# Patient Record
Sex: Female | Born: 1970 | Race: Black or African American | Hispanic: No | Marital: Single | Smoking: Current every day smoker
Health system: Southern US, Community
[De-identification: ages and names within clinical notes are randomized; demographics above are authoritative.]

## PROBLEM LIST (undated history)

## (undated) DIAGNOSIS — K219 Gastro-esophageal reflux disease without esophagitis: Secondary | ICD-10-CM

## (undated) DIAGNOSIS — F32A Depression, unspecified: Secondary | ICD-10-CM

## (undated) DIAGNOSIS — M5106 Intervertebral disc disorders with myelopathy, lumbar region: Secondary | ICD-10-CM

## (undated) DIAGNOSIS — F329 Major depressive disorder, single episode, unspecified: Secondary | ICD-10-CM

## (undated) DIAGNOSIS — M62838 Other muscle spasm: Secondary | ICD-10-CM

## (undated) DIAGNOSIS — E0500 Thyrotoxicosis with diffuse goiter without thyrotoxic crisis or storm: Secondary | ICD-10-CM

## (undated) DIAGNOSIS — R609 Edema, unspecified: Secondary | ICD-10-CM

## (undated) DIAGNOSIS — M1990 Unspecified osteoarthritis, unspecified site: Secondary | ICD-10-CM

## (undated) DIAGNOSIS — I10 Essential (primary) hypertension: Secondary | ICD-10-CM

## (undated) DIAGNOSIS — G4730 Sleep apnea, unspecified: Secondary | ICD-10-CM

## (undated) DIAGNOSIS — C73 Malignant neoplasm of thyroid gland: Secondary | ICD-10-CM

## (undated) DIAGNOSIS — K259 Gastric ulcer, unspecified as acute or chronic, without hemorrhage or perforation: Secondary | ICD-10-CM

## (undated) HISTORY — PX: TONSILLECTOMY: SUR1361

## (undated) HISTORY — PX: OTHER SURGICAL HISTORY: SHX169

## (undated) HISTORY — PX: COLONOSCOPY: SHX174

## (undated) HISTORY — PX: CARPAL TUNNEL RELEASE: SHX101

---

## 1998-01-06 ENCOUNTER — Encounter: Admission: RE | Admit: 1998-01-06 | Discharge: 1998-01-06 | Payer: Self-pay | Admitting: Internal Medicine

## 1998-02-03 ENCOUNTER — Emergency Department (HOSPITAL_COMMUNITY): Admission: EM | Admit: 1998-02-03 | Discharge: 1998-02-03 | Payer: Self-pay | Admitting: Emergency Medicine

## 1998-06-11 ENCOUNTER — Emergency Department (HOSPITAL_COMMUNITY): Admission: EM | Admit: 1998-06-11 | Discharge: 1998-06-11 | Payer: Self-pay | Admitting: Emergency Medicine

## 1998-06-14 ENCOUNTER — Encounter: Admission: RE | Admit: 1998-06-14 | Discharge: 1998-06-14 | Payer: Self-pay | Admitting: Internal Medicine

## 1998-06-21 ENCOUNTER — Encounter: Admission: RE | Admit: 1998-06-21 | Discharge: 1998-06-21 | Payer: Self-pay | Admitting: Internal Medicine

## 1998-12-21 ENCOUNTER — Encounter: Admission: RE | Admit: 1998-12-21 | Discharge: 1998-12-21 | Payer: Self-pay | Admitting: Internal Medicine

## 1999-06-28 ENCOUNTER — Ambulatory Visit (HOSPITAL_COMMUNITY): Admission: RE | Admit: 1999-06-28 | Discharge: 1999-06-28 | Payer: Self-pay | Admitting: Family Medicine

## 1999-06-28 ENCOUNTER — Encounter: Payer: Self-pay | Admitting: Family Medicine

## 1999-12-01 ENCOUNTER — Encounter: Payer: Self-pay | Admitting: Emergency Medicine

## 1999-12-01 ENCOUNTER — Inpatient Hospital Stay (HOSPITAL_COMMUNITY): Admission: EM | Admit: 1999-12-01 | Discharge: 1999-12-05 | Payer: Self-pay | Admitting: Emergency Medicine

## 1999-12-01 ENCOUNTER — Encounter: Payer: Self-pay | Admitting: Pulmonary Disease

## 1999-12-02 ENCOUNTER — Encounter: Payer: Self-pay | Admitting: Pulmonary Disease

## 2000-07-10 ENCOUNTER — Ambulatory Visit (HOSPITAL_COMMUNITY): Admission: RE | Admit: 2000-07-10 | Discharge: 2000-07-10 | Payer: Self-pay | Admitting: Family Medicine

## 2000-07-10 ENCOUNTER — Encounter: Payer: Self-pay | Admitting: Family Medicine

## 2000-08-02 ENCOUNTER — Emergency Department (HOSPITAL_COMMUNITY): Admission: EM | Admit: 2000-08-02 | Discharge: 2000-08-02 | Payer: Self-pay | Admitting: Emergency Medicine

## 2000-08-02 ENCOUNTER — Encounter: Payer: Self-pay | Admitting: Emergency Medicine

## 2011-03-14 DIAGNOSIS — K644 Residual hemorrhoidal skin tags: Secondary | ICD-10-CM | POA: Diagnosis not present

## 2011-03-26 DIAGNOSIS — L0291 Cutaneous abscess, unspecified: Secondary | ICD-10-CM | POA: Diagnosis not present

## 2011-04-03 DIAGNOSIS — K602 Anal fissure, unspecified: Secondary | ICD-10-CM | POA: Diagnosis not present

## 2011-04-04 DIAGNOSIS — Z01818 Encounter for other preprocedural examination: Secondary | ICD-10-CM | POA: Diagnosis not present

## 2011-04-04 DIAGNOSIS — K602 Anal fissure, unspecified: Secondary | ICD-10-CM | POA: Diagnosis not present

## 2011-04-05 DIAGNOSIS — I10 Essential (primary) hypertension: Secondary | ICD-10-CM | POA: Diagnosis not present

## 2011-04-05 DIAGNOSIS — K649 Unspecified hemorrhoids: Secondary | ICD-10-CM | POA: Diagnosis not present

## 2011-04-05 DIAGNOSIS — K625 Hemorrhage of anus and rectum: Secondary | ICD-10-CM | POA: Diagnosis not present

## 2011-04-05 DIAGNOSIS — K602 Anal fissure, unspecified: Secondary | ICD-10-CM | POA: Diagnosis not present

## 2011-04-05 DIAGNOSIS — Z01818 Encounter for other preprocedural examination: Secondary | ICD-10-CM | POA: Diagnosis not present

## 2011-04-09 DIAGNOSIS — K603 Anal fistula: Secondary | ICD-10-CM | POA: Diagnosis not present

## 2011-04-09 DIAGNOSIS — K602 Anal fissure, unspecified: Secondary | ICD-10-CM | POA: Diagnosis not present

## 2011-04-09 DIAGNOSIS — K649 Unspecified hemorrhoids: Secondary | ICD-10-CM | POA: Diagnosis not present

## 2011-04-09 DIAGNOSIS — I10 Essential (primary) hypertension: Secondary | ICD-10-CM | POA: Diagnosis not present

## 2011-04-26 DIAGNOSIS — E039 Hypothyroidism, unspecified: Secondary | ICD-10-CM | POA: Diagnosis not present

## 2011-04-26 DIAGNOSIS — K649 Unspecified hemorrhoids: Secondary | ICD-10-CM | POA: Diagnosis not present

## 2011-07-12 DIAGNOSIS — E039 Hypothyroidism, unspecified: Secondary | ICD-10-CM | POA: Diagnosis not present

## 2011-07-12 DIAGNOSIS — F329 Major depressive disorder, single episode, unspecified: Secondary | ICD-10-CM | POA: Diagnosis not present

## 2011-08-17 DIAGNOSIS — I10 Essential (primary) hypertension: Secondary | ICD-10-CM | POA: Diagnosis not present

## 2011-08-17 DIAGNOSIS — L259 Unspecified contact dermatitis, unspecified cause: Secondary | ICD-10-CM | POA: Diagnosis not present

## 2011-09-21 DIAGNOSIS — I10 Essential (primary) hypertension: Secondary | ICD-10-CM | POA: Diagnosis not present

## 2011-09-21 DIAGNOSIS — E039 Hypothyroidism, unspecified: Secondary | ICD-10-CM | POA: Diagnosis not present

## 2011-12-24 DIAGNOSIS — M461 Sacroiliitis, not elsewhere classified: Secondary | ICD-10-CM | POA: Diagnosis not present

## 2011-12-24 DIAGNOSIS — G5600 Carpal tunnel syndrome, unspecified upper limb: Secondary | ICD-10-CM | POA: Diagnosis not present

## 2012-01-07 DIAGNOSIS — F17200 Nicotine dependence, unspecified, uncomplicated: Secondary | ICD-10-CM | POA: Diagnosis not present

## 2012-01-07 DIAGNOSIS — M25519 Pain in unspecified shoulder: Secondary | ICD-10-CM | POA: Diagnosis not present

## 2012-01-07 DIAGNOSIS — R252 Cramp and spasm: Secondary | ICD-10-CM | POA: Diagnosis not present

## 2012-01-07 DIAGNOSIS — M19019 Primary osteoarthritis, unspecified shoulder: Secondary | ICD-10-CM | POA: Diagnosis not present

## 2012-01-07 DIAGNOSIS — E039 Hypothyroidism, unspecified: Secondary | ICD-10-CM | POA: Diagnosis not present

## 2012-04-29 DIAGNOSIS — E039 Hypothyroidism, unspecified: Secondary | ICD-10-CM | POA: Diagnosis not present

## 2012-04-29 DIAGNOSIS — R252 Cramp and spasm: Secondary | ICD-10-CM | POA: Diagnosis not present

## 2012-09-03 DIAGNOSIS — Z3009 Encounter for other general counseling and advice on contraception: Secondary | ICD-10-CM | POA: Diagnosis not present

## 2012-09-03 DIAGNOSIS — I10 Essential (primary) hypertension: Secondary | ICD-10-CM | POA: Diagnosis not present

## 2012-09-03 DIAGNOSIS — E039 Hypothyroidism, unspecified: Secondary | ICD-10-CM | POA: Diagnosis not present

## 2012-11-12 DIAGNOSIS — M5137 Other intervertebral disc degeneration, lumbosacral region: Secondary | ICD-10-CM | POA: Diagnosis not present

## 2012-11-12 DIAGNOSIS — I10 Essential (primary) hypertension: Secondary | ICD-10-CM | POA: Diagnosis not present

## 2012-11-12 DIAGNOSIS — M545 Low back pain: Secondary | ICD-10-CM | POA: Diagnosis not present

## 2012-11-12 DIAGNOSIS — R209 Unspecified disturbances of skin sensation: Secondary | ICD-10-CM | POA: Diagnosis not present

## 2012-11-12 DIAGNOSIS — M4310 Spondylolisthesis, site unspecified: Secondary | ICD-10-CM | POA: Diagnosis not present

## 2012-11-12 DIAGNOSIS — E039 Hypothyroidism, unspecified: Secondary | ICD-10-CM | POA: Diagnosis not present

## 2012-11-12 DIAGNOSIS — M549 Dorsalgia, unspecified: Secondary | ICD-10-CM | POA: Diagnosis not present

## 2012-11-12 DIAGNOSIS — M47817 Spondylosis without myelopathy or radiculopathy, lumbosacral region: Secondary | ICD-10-CM | POA: Diagnosis not present

## 2012-11-12 DIAGNOSIS — R252 Cramp and spasm: Secondary | ICD-10-CM | POA: Diagnosis not present

## 2013-02-02 DIAGNOSIS — M549 Dorsalgia, unspecified: Secondary | ICD-10-CM | POA: Diagnosis not present

## 2013-02-02 DIAGNOSIS — J069 Acute upper respiratory infection, unspecified: Secondary | ICD-10-CM | POA: Diagnosis not present

## 2013-02-02 DIAGNOSIS — I10 Essential (primary) hypertension: Secondary | ICD-10-CM | POA: Diagnosis not present

## 2013-02-02 DIAGNOSIS — E039 Hypothyroidism, unspecified: Secondary | ICD-10-CM | POA: Diagnosis not present

## 2013-10-15 ENCOUNTER — Encounter (HOSPITAL_COMMUNITY): Payer: Self-pay | Admitting: Emergency Medicine

## 2013-10-15 ENCOUNTER — Emergency Department (HOSPITAL_COMMUNITY): Payer: Medicare Other

## 2013-10-15 ENCOUNTER — Emergency Department (HOSPITAL_COMMUNITY)
Admission: EM | Admit: 2013-10-15 | Discharge: 2013-10-15 | Disposition: A | Discharge status: Home / Self Care | Payer: Medicare Other | Attending: Emergency Medicine | Admitting: Emergency Medicine

## 2013-10-15 DIAGNOSIS — Y929 Unspecified place or not applicable: Secondary | ICD-10-CM | POA: Insufficient documentation

## 2013-10-15 DIAGNOSIS — W19XXXA Unspecified fall, initial encounter: Secondary | ICD-10-CM

## 2013-10-15 DIAGNOSIS — F17200 Nicotine dependence, unspecified, uncomplicated: Secondary | ICD-10-CM | POA: Diagnosis not present

## 2013-10-15 DIAGNOSIS — Z85850 Personal history of malignant neoplasm of thyroid: Secondary | ICD-10-CM | POA: Insufficient documentation

## 2013-10-15 DIAGNOSIS — M545 Low back pain, unspecified: Secondary | ICD-10-CM | POA: Diagnosis not present

## 2013-10-15 DIAGNOSIS — I10 Essential (primary) hypertension: Secondary | ICD-10-CM | POA: Diagnosis not present

## 2013-10-15 DIAGNOSIS — R296 Repeated falls: Secondary | ICD-10-CM | POA: Diagnosis not present

## 2013-10-15 DIAGNOSIS — S79929A Unspecified injury of unspecified thigh, initial encounter: Secondary | ICD-10-CM | POA: Diagnosis not present

## 2013-10-15 DIAGNOSIS — S99919A Unspecified injury of unspecified ankle, initial encounter: Secondary | ICD-10-CM

## 2013-10-15 DIAGNOSIS — S99929A Unspecified injury of unspecified foot, initial encounter: Secondary | ICD-10-CM

## 2013-10-15 DIAGNOSIS — F329 Major depressive disorder, single episode, unspecified: Secondary | ICD-10-CM | POA: Diagnosis not present

## 2013-10-15 DIAGNOSIS — R21 Rash and other nonspecific skin eruption: Secondary | ICD-10-CM | POA: Insufficient documentation

## 2013-10-15 DIAGNOSIS — Z79899 Other long term (current) drug therapy: Secondary | ICD-10-CM | POA: Diagnosis not present

## 2013-10-15 DIAGNOSIS — M25559 Pain in unspecified hip: Secondary | ICD-10-CM | POA: Diagnosis not present

## 2013-10-15 DIAGNOSIS — Z8739 Personal history of other diseases of the musculoskeletal system and connective tissue: Secondary | ICD-10-CM | POA: Insufficient documentation

## 2013-10-15 DIAGNOSIS — S79919A Unspecified injury of unspecified hip, initial encounter: Secondary | ICD-10-CM | POA: Diagnosis not present

## 2013-10-15 DIAGNOSIS — F3289 Other specified depressive episodes: Secondary | ICD-10-CM | POA: Diagnosis not present

## 2013-10-15 DIAGNOSIS — IMO0002 Reserved for concepts with insufficient information to code with codable children: Secondary | ICD-10-CM | POA: Insufficient documentation

## 2013-10-15 DIAGNOSIS — S8990XA Unspecified injury of unspecified lower leg, initial encounter: Secondary | ICD-10-CM | POA: Diagnosis not present

## 2013-10-15 DIAGNOSIS — Y9389 Activity, other specified: Secondary | ICD-10-CM | POA: Diagnosis not present

## 2013-10-15 DIAGNOSIS — M79605 Pain in left leg: Secondary | ICD-10-CM

## 2013-10-15 DIAGNOSIS — M79609 Pain in unspecified limb: Secondary | ICD-10-CM | POA: Diagnosis not present

## 2013-10-15 HISTORY — DX: Major depressive disorder, single episode, unspecified: F32.9

## 2013-10-15 HISTORY — DX: Depression, unspecified: F32.A

## 2013-10-15 HISTORY — DX: Unspecified osteoarthritis, unspecified site: M19.90

## 2013-10-15 HISTORY — DX: Essential (primary) hypertension: I10

## 2013-10-15 HISTORY — DX: Intervertebral disc disorders with myelopathy, lumbar region: M51.06

## 2013-10-15 HISTORY — DX: Other muscle spasm: M62.838

## 2013-10-15 HISTORY — DX: Malignant neoplasm of thyroid gland: C73

## 2013-10-15 MED ORDER — OXYCODONE-ACETAMINOPHEN 5-325 MG PO TABS
2.0000 | ORAL_TABLET | Freq: Once | ORAL | Status: AC
  Administered 2013-10-15: 2 via ORAL
  Filled 2013-10-15: qty 2

## 2013-10-15 MED ORDER — OXYCODONE-ACETAMINOPHEN 5-325 MG PO TABS: 1.0000 | ORAL_TABLET | Freq: Four times a day (QID) | ORAL | Status: DC | PRN

## 2013-10-15 MED ORDER — PERMETHRIN 5 % EX CREA: 1.0000 "application " | TOPICAL_CREAM | Freq: Once | CUTANEOUS | Status: DC

## 2013-10-15 MED ORDER — LISINOPRIL 10 MG PO TABS: 10.0000 mg | ORAL_TABLET | Freq: Every day | ORAL | Status: DC

## 2013-10-15 MED ORDER — CYCLOBENZAPRINE HCL 10 MG PO TABS: 10.0000 mg | ORAL_TABLET | Freq: Two times a day (BID) | ORAL | Status: DC | PRN

## 2013-10-15 NOTE — Discharge Instructions (Signed)
°Emergency Department Resource Guide °1) Find a Doctor and Pay Out of Pocket °Although you won't have to find out who is covered by your insurance plan, it is a good idea to ask around and get recommendations. You will then need to call the office and see if the doctor you have chosen will accept you as a new patient and what types of options they offer for patients who are self-pay. Some doctors offer discounts or will set up payment plans for their patients who do not have insurance, but you will need to ask so you aren't surprised when you get to your appointment. ° °2) Contact Your Local Health Department °Not all health departments have doctors that can see patients for sick visits, but many do, so it is worth a call to see if yours does. If you don't know where your local health department is, you can check in your phone book. The CDC also has a tool to help you locate your state's health department, and many state websites also have listings of all of their local health departments. ° °3) Find a Walk-in Clinic °If your illness is not likely to be very severe or complicated, you may want to try a walk in clinic. These are popping up all over the country in pharmacies, drugstores, and shopping centers. They're usually staffed by nurse practitioners or physician assistants that have been trained to treat common illnesses and complaints. They're usually fairly quick and inexpensive. However, if you have serious medical issues or chronic medical problems, these are probably not your best option. ° °No Primary Care Doctor: °- Call [facility] at  832-8000 - they can help you locate a primary care doctor that  accepts your insurance, provides certain services, etc. °- Physician Referral Service- 1-800-533-3463 ° °Chronic Pain Problems: °Organization         Address  Phone   Notes  °[facility]  (336) 297-2271 Patients need to be referred by their primary care doctor.  ° °Medication  Assistance: °Organization         Address  Phone   Notes  °[place] [facility] [address] °[place], [place] [address] (336) 641-8030 --Must be a resident of [place] °-- Must have NO insurance coverage whatsoever (no Medicaid/ Medicare, etc.) °-- The pt. MUST have a primary care doctor that directs their care regularly and follows them in the community °  °[facility]  (866) 331-1348   °[employer]  (888) 892-1162   ° °Agencies that provide inexpensive medical care: °Organization         Address  Phone   Notes  °[facility]  (336) 832-8035   ° Internal Medicine    (336) 832-7272   °[facility] [address] °[place], [place] [address] (336) 832-4777   °[facility] [address], °[place] (336) 271-4999   °[facility]    (336) 373-0678   °[facility]    (336) 272-1050   °[facility] ° [address], [place] Phone:  (336) 832-4444, Fax:  (336) 832-4440 Hours of Operation:  9 am - 6 pm, M-F.  Also accepts Medicaid/Medicare and self-pay.  °[facility] ° [address], [place] Phone: (336) 832-3150, Fax: (336) 832-3151. Hours of Operation:  8:30 am - 5:30 pm, M-F.  Also accepts Medicaid and self-pay.  °[facility] [place] [address], [place] Phone: (336) 878-6027   °[facility] [address], [place], [place] (336)723-1848, Ext. 123 Mondays & Thursdays: 7-9 AM.  First 15 patients are seen on a first come, first serve basis. °  ° °Medicaid-accepting [place] Providers: ° °Organization         Address  Phone   Notes  °[facility] [address], [place] (336) 641-2100 Also accepts self-pay patients.  °[facility] [address], [place] ° (336) 856-9996   °[facility] [address], [place]  (336) 288-8857   °[facility] [address] [place] [address], [place] (336) 299-7000   °Veita Bland [address], [place]  ° (336) 373-1557 Only accepts [place] Access Medicaid patients after they have their name applied to their card.  ° °Self-Pay (no insurance) in [place]: ° °Organization         Address  Phone   Notes  °Sickle Cell Patients, [facility] [address], [place] (336) 832-1970   °[facility] [address], [place] (336) 832-4400   °[facility] [place] ° [address], [place] (336) 992-4800   °[facility]/Dr. Osei-Bonsu ° [address] [place] [address], [place] or [address], [place] (336) 841-8500 Phone number for both [place] and [place] locations is the same.  °[facility] [address], [place] (336) 299-0000   °[facility] [address] [place] [address], [place] or [address] (336) 852-7530 °(336) 878-2260   °[facility] [address], [place] (336) 350-1642, phone; (336) 294-5005, fax Sees patients 1st and 3rd Saturday of every month.  Must not qualify for public or private insurance (i.e. Medicaid, Medicare, [place] Health Choice, Veterans' Benefits) • Household income should be no more than 200% of the poverty level •The clinic cannot treat you if you are pregnant or think you are pregnant • Sexually transmitted diseases are not treated at the clinic.  ° ° °Dental Care: °Organization         Address  Phone  Notes  °[place] [facility] [address], [place] (336) 641-6152 Accepts children up to age 21 who are enrolled in Medicaid or [place] Health Choice; pregnant women with a Medicaid card; and children who have applied for Medicaid or [place] Health Choice, but were declined, whose parents can pay a reduced fee at time of service.  °[place]  [facility] [place]  [address], [place] (336) 641-7733 Accepts children up to age 21 who are enrolled in Medicaid or [place] Health Choice; pregnant women with a Medicaid card; and children who have applied for Medicaid or [place] Health Choice, but were declined, whose parents can pay a reduced fee at time of service.  °[facility] ° [address], [place] (336) 641-4533 Patients are seen by appointment only. Walk-ins are not accepted. [facility] will see patients 18 years of age and older. °Monday - Tuesday (8am-5pm) °Most Wednesdays (8:30-5pm) °$30 per visit, cash only  °[facility] ° [address], [place] (336) 641-4533 Patients are seen by appointment only. Walk-ins are not accepted. [facility] will see patients 18 years of age and older. °One   Wednesday Evening (Monthly: Volunteer Based).  $30 per visit, cash only  °[facility]  (919) 537-3737 for adults; Children under age 4, call Graduate Pediatric Dentistry at (919) [phone]. Children aged 4-14, please call (919) [phone] to request a pediatric application. ° Dental services are provided in all areas of dental care including fillings, crowns and bridges, complete and partial dentures, implants, gum treatment, root canals, and extractions. Preventive care is also provided. Treatment is provided to both adults and children. °Patients are selected via a lottery and there is often a waiting list. °  °[facility] [address], °[place] ° (336) 763-8833 [email or web address] °  °[facility] [address], [place], [place] (336)723-1848, Ext. 123 Second and Fourth Thursday of each month, opens at 6:30 AM; Clinic ends at 9 AM.  Patients are seen on a first-come first-served basis, and a limited number are seen during each clinic.  ° °[facility] ° [address], [place], [place] (336) 723-7904    Eligibility Requirements °You must have lived in [place], [place], or [place] counties for at least the last three months. °  You cannot be eligible for state or federal sponsored healthcare insurance, including Veterans Administration, Medicaid, or Medicare. °  You generally cannot be eligible for healthcare insurance through your employer.  °  How to apply: °Eligibility screenings are held every Tuesday and Wednesday afternoon from 1:00 pm until 4:00 pm. You do not need an appointment for the interview!  °[facility] [address], [place], [place] 336-631-2330   °[place] [facility]  336-342-8273   °[place]  336-703-3100   ° [facility]  336-570-6415   ° °Behavioral Health Resources in the Community: °Intensive Outpatient Programs °Organization         Address  Phone  Notes  °[facility] [address], [place], [place] 336-878-6098   °[facility] [address], [place], [place] 336-832-9800   °[facility] [address], [place], [place] ° 336-882-2125   °[facility] [address],  °[place], [place] 1-800-853-5163 or 336-641-4981   °Substance Abuse Resources °Organization         Address  Phone  Notes  °[facility]  336-882-2125   °[facility]  336-784-9470   °[facility]  336-285-9073   °[facility]  336-845-3988   °Residential & Outpatient Substance Abuse Program  1-800-659-3381   °Psychological Services °Organization         Address  Phone  Notes  °[facility]  336- 832-9600   °[facility]  336- 378-7881   °[facility] [address], [place] 1-800-853-5163 or 336-641-4981   ° °Mobile Crisis Teams °Organization         Address  Phone  Notes  °[facility], Mobile Crisis Care Unit  1-877-626-1772   °Assertive °[facility] ° [address]  [place], [place] 336-834-9664   °Sharon DeEsch [address] °[place] 336-554-5454   ° °Self-Help/Support Groups °Organization         Address  Phone             Notes  °[facility]  - variety of support groups  336- 373-1402 Call for more information  °Narcotics Anonymous (NA), [facility] [address], °[place]  2 meetings at this location  ° °  Residential Treatment Programs °Organization         Address  Phone  Notes  °[facility] [address],    °[place]  1-866-801-8205   °[facility] ° [address], [place], [place] 704-293-8524   °[facility] [address], [place] 336-845-3988 Admissions: 8am-3pm M-F  °[facility] [address]    °[place], [place] 336-841-1104   °[facility] [address], [place], [place] 336-379-7146   °[facility] [address]  °[place], [place] 336-285-9073   °[facility] Intensive Outpatient [address], [place], [place] 336-852-3033   °[facility] [address]  °[place], [place] 1-877-615-2722 or 336-784-9470   °[facility] [address] [place], [place] 336-227-7417 Accepts Medicaid  °[facility] [address]  °[place] 1-800-659-3381 Substance Abuse/Addiction Treatment  ° °[place] Behavioral Health Resources °Organization         Address  Phone  Notes  °[facility]  (888) 581-9988   °Julie Brannon, PhD [address] [place], [place]   (336) 349-5553 or (336) 951-0000   °[facility]   [address] °[place], [place] (336) 349-4454   °[facility] [address], [place], [place] (336) 342-8316 Insurance/Medicaid/sponsorship through [facility]  °[facility] [address]                                    [place], [place] (336) 342-8316 Therapy/tele-psych/case    °[facility] [address]  ° [place], [place] (336) 349-2233    °Dr. Arfeen  (336) 349-4544   °[facility] [place]  [employer] [place] [facility] 1) [address], [place] °2) [address], [place] °3)  [address], [place] (336) 349-3220 °(336) 342-7768 ° °(336) 342-8140   °[place] [facility] (336) 342-1394 or (336) 342-3537 (After Hours)    ° ° °

## 2013-10-15 NOTE — Progress Notes (Signed)
VASCULAR LAB PRELIMINARY  PRELIMINARY  PRELIMINARY  PRELIMINARY  Left lower extremity venous duplex completed.    Preliminary report:  Left:  No evidence of DVT, superficial thrombosis, or Baker's cyst.  Riad Wagley, RVS 10/15/2013, 2:34 PM

## 2013-10-15 NOTE — ED Provider Notes (Signed)
CSN: [id number]     Arrival date & time 10/15/13  1028 History   First MD Initiated Contact with Patient 10/15/13 1224     Chief Complaint  Patient presents with  . Leg Pain    lower leg swelling and pain  . Hip Pain    radiating pain  . Back Pain    fell 7 days ago     (Consider location/radiation/quality/duration/timing/severity/associated sxs/prior Treatment) Patient is a 43 y.o. female presenting with leg pain. The history is provided by the patient.  Leg Pain Location:  Leg Time since incident:  1 week Injury: yes   Mechanism of injury: fall   Fall:    Fall occurred: off toilet.   Impact surface:  Hard floor   Point of impact:  Unable to specify   Entrapped after fall: no   Leg location:  L leg Pain details:    Quality:  Aching   Radiates to:  Does not radiate   Severity:  Moderate   Onset quality:  Sudden   Duration:  1 week   Timing:  Constant   Progression:  Unchanged Chronicity:  New Dislocation: no   Foreign body present:  No foreign bodies Prior injury to area:  No Relieved by:  Nothing Worsened by:  Activity Ineffective treatments:  None tried Associated symptoms: no back pain, no fatigue, no fever and no neck pain     Past Medical History  Diagnosis Date  . Thyroid cancer   . Depression   . Muscle spasms of both lower extremities   . Disc disease with myelopathy, lumbar   . Arthritis   . Hypertension    Past Surgical History  Procedure Laterality Date  . Colonoscopy      repair of fissures  . Glands      under arms-removed   Family History  Problem Relation Age of Onset  . Diabetes Mother   . Hypertension Mother   . Diabetes Father   . Hypertension Father   . Heart failure Father    History  Substance Use Topics  . Smoking status: Current Every Day Smoker    Types: Cigarettes  . Smokeless tobacco: Not on file  . Alcohol Use: Yes   OB History   Grav Para Term Preterm Abortions TAB SAB Ect Mult Living                 Review  of Systems  Constitutional: Negative for fever and fatigue.  HENT: Negative for congestion and drooling.   Eyes: Negative for pain.  Respiratory: Negative for cough and shortness of breath.   Cardiovascular: Negative for chest pain.  Gastrointestinal: Negative for nausea, vomiting, abdominal pain and diarrhea.  Genitourinary: Negative for dysuria and hematuria.  Musculoskeletal: Negative for back pain, gait problem and neck pain.  Skin: Negative for color change.  Neurological: Negative for dizziness and headaches.  Hematological: Negative for adenopathy.  Psychiatric/Behavioral: Negative for behavioral problems.  All other systems reviewed and are negative.     Allergies  Review of patient's allergies indicates no known allergies.  Home Medications   Prior to Admission medications   Medication Sig Start Date End Date Taking? Authorizing Provider  acetaminophen (TYLENOL) 500 MG tablet Take 1,500 mg by mouth every 6 (six) hours as needed (pain.).   Yes Historical Provider, MD  hydrOXYzine (ATARAX/VISTARIL) 25 MG tablet Take 25 mg by mouth every 6 (six) hours as needed for itching.   Yes Historical Provider, MD  levothyroxine (SYNTHROID, LEVOTHROID)  175 MCG tablet Take 175 mcg by mouth daily before breakfast.   Yes Historical Provider, MD  lisinopril (PRINIVIL,ZESTRIL) 10 MG tablet Take 10 mg by mouth daily.   Yes Historical Provider, MD  NIFEdipine (PROCARDIA-XL/ADALAT CC) 60 MG 24 hr tablet Take 60 mg by mouth daily.   Yes Historical Provider, MD  ranitidine (ZANTAC) 300 MG tablet Take 300 mg by mouth 2 (two) times daily.   Yes Historical Provider, MD  venlafaxine XR (EFFEXOR-XR) 75 MG 24 hr capsule Take 225 mg by mouth daily with breakfast.    Yes Historical Provider, MD   BP 113/57  Pulse 103  Temp(Src) 98.4 F (36.9 C) (Oral)  Resp 20  SpO2 100%  LMP 10/06/2013 Physical Exam  Nursing note and vitals reviewed. Constitutional: She is oriented to person, place, and time. She  appears well-developed and well-nourished.  HENT:  Head: Normocephalic and atraumatic.  Mouth/Throat: Oropharynx is clear and moist. No oropharyngeal exudate.  Eyes: Conjunctivae and EOM are normal. Pupils are equal, round, and reactive to light.  Neck: Normal range of motion. Neck supple.  Cardiovascular: Normal rate, regular rhythm, normal heart sounds and intact distal pulses.  Exam reveals no gallop and no friction rub.   No murmur heard. Pulmonary/Chest: Effort normal and breath sounds normal. No respiratory distress. She has no wheezes.  Abdominal: Soft. Bowel sounds are normal. There is no tenderness. There is no rebound and no guarding.  Musculoskeletal: Normal range of motion. She exhibits tenderness. She exhibits no edema.  Mild tenderness to palpation of the left posterior thigh and left lumbar paraspinal area.  Small soft nodular area noted over the left mid tibia.   Neurological: She is alert and oriented to person, place, and time.  Reflex Scores:      Patellar reflexes are 2+ on the right side and 2+ on the left side.      Achilles reflexes are 2+ on the right side and 2+ on the left side. alert, oriented x3 speech: normal in context and clarity memory: intact grossly cranial nerves II-XII: intact motor strength: full proximally and distally no involuntary movements or tremors Sensation: Mildly altered sensation to light touch in the LLE, otherwise intact to light touch diffusely  cerebellar: finger-to-nose and heel-to-shin intact gait: antalgic gait   Skin: Skin is warm and dry. Rash noted.  Several macular papular lesions noted sparingly on the mid back.  Psychiatric: She has a normal mood and affect. Her behavior is normal.    ED Course  Procedures (including critical care time) Labs Review Labs Reviewed - No data to display  Imaging Review Dg Hip Complete Left  10/15/2013   CLINICAL DATA:  Severe thinning cramps  EXAM: LEFT HIP - COMPLETE 2+ VIEW   COMPARISON:  None.  FINDINGS: There is no evidence of hip fracture or dislocation. There is no evidence of arthropathy or other focal bone abnormality.  IMPRESSION: Negative.   Electronically Signed   By: Kathreen Devoid   On: 10/15/2013 12:48   Dg Tibia/fibula Left  10/15/2013   CLINICAL DATA:  Severe pain and cramps  EXAM: LEFT TIBIA AND FIBULA - 2 VIEW  COMPARISON:  None.  FINDINGS: No acute fracture or dislocation. No lytic or sclerotic osseous lesion. Tricompartmental osteoarthritis of the knee.  IMPRESSION: No acute osseous injury of the left tibia or fibula.   Electronically Signed   By: Kathreen Devoid   On: 10/15/2013 12:48     EKG Interpretation   Date/Time:  Thursday  October 15 2013 10:43:35 EDT Ventricular Rate:  94 PR Interval:    QRS Duration: 75 QT Interval:  446 QTC Calculation: 558 R Axis:   56 Text Interpretation:  sinus rhythm Borderline repolarization abnormality  Prolonged QT interval Baseline wander in lead(s) II III aVL aVF V4 no  previous for comparison Confirmed by Tod Abrahamsen  MD, [place] ([id number]) on  10/15/2013 12:28:17 PM      MDM   Final diagnoses:  Low back pain radiating to left lower extremity  Fall, initial encounter    12:53 PM 43 y.o. female pw mechanical fall while getting off toilet 1 week ago. Did not hit head or have loc. Patient has had paresthesias in the left lower extremity worsening since the fall. She has no weakness. She states that the pain radiates from her left low back to her left thigh. She is afebrile and vital signs are unremarkable here. Will get screening imaging and pain control. Possible sciatica. Pt also concerned about DVT. Will get [place] to rule this out.   3:12 PM: I interpreted/reviewed the labs and/or imaging which were non-contributory.  Pt's paresthesias likely related to sciatica and recent fall. Pt notes she was supposed to have back surgery in the past but never got it. She is able to ambulate. Pt requests refill of her lisinopril  and also tx for scabies.  I have discussed the diagnosis/risks/treatment options with the patient and believe the pt to be eligible for discharge home to follow-up with and establish w/ a pcp as well as an orthopedist for formal evaluation of her back. We also discussed returning to the ED immediately if new or worsening sx occur. We discussed the sx which are most concerning (e.g., worsening pain, fever, weakness, numbness, bowel/bladd incont) that necessitate immediate return. Medications administered to the patient during their visit and any new prescriptions provided to the patient are listed below.  Medications given during this visit Medications  oxyCODONE-acetaminophen (PERCOCET/ROXICET) 5-325 MG per tablet 2 tablet (2 tablets Oral Given 10/15/13 1315)    Discharge Medication List as of 10/15/2013  3:20 PM    START taking these medications   Details  cyclobenzaprine (FLEXERIL) 10 MG tablet Take 1 tablet (10 mg total) by mouth 2 (two) times daily as needed for muscle spasms., Starting 10/15/2013, Until Discontinued, Print    !! lisinopril (PRINIVIL) 10 MG tablet Take 1 tablet (10 mg total) by mouth daily., Starting 10/15/2013, Until Discontinued, Print    oxyCODONE-acetaminophen (PERCOCET) 5-325 MG per tablet Take 1 tablet by mouth every 6 (six) hours as needed for moderate pain., Starting 10/15/2013, Until Discontinued, Print    permethrin (ACTICIN) 5 % cream Apply 1 application topically once. Put a thin layer on the skin from the neck to the toes. Put in the skin folds, creases, and spaces between the fingers and toes. Wash off after 8-14 hrs. May be repeated in 1 week., Starting 10/15/2013, Print     !! - Potential duplicate medications found. Please discuss with provider.       Pamella Pert, MD 10/15/13 [phone]

## 2013-10-15 NOTE — ED Notes (Signed)
Bed: WA03 Expected date:  Expected time:  Means of arrival:  Comments: seizure

## 2013-10-15 NOTE — ED Notes (Addendum)
Pt reported  That she  fell 7 days ago, twisted leg and foot due to loss of balance as she got off commode. Pt has swelling and knots in l/lower leg,pain is radiating up to hip and back. Pt stated that she has difficulty walking, mother assisted pt to car. Pt is alert, oriented, appropriate.  Pt stated that she has hx of blood clots, under treatment for "thyroid cancer" Pt postponed care hoping that the pain would go away. Moved back to this area 6 months ago. Does not have PCP

## 2013-10-15 NOTE — ED Notes (Signed)
Venous study is neg. MD aware.

## 2013-10-27 ENCOUNTER — Ambulatory Visit (INDEPENDENT_AMBULATORY_CARE_PROVIDER_SITE_OTHER): Payer: Medicare Other | Admitting: Internal Medicine

## 2013-10-27 ENCOUNTER — Other Ambulatory Visit (INDEPENDENT_AMBULATORY_CARE_PROVIDER_SITE_OTHER): Payer: Medicare Other

## 2013-10-27 ENCOUNTER — Encounter: Payer: Self-pay | Admitting: Internal Medicine

## 2013-10-27 VITALS — BP 132/62 | HR 102 | Temp 98.1°F | Resp 22 | Ht 64.0 in | Wt 281.4 lb

## 2013-10-27 DIAGNOSIS — I10 Essential (primary) hypertension: Secondary | ICD-10-CM

## 2013-10-27 DIAGNOSIS — Z6841 Body Mass Index (BMI) 40.0 and over, adult: Secondary | ICD-10-CM

## 2013-10-27 DIAGNOSIS — Z79899 Other long term (current) drug therapy: Secondary | ICD-10-CM | POA: Diagnosis not present

## 2013-10-27 DIAGNOSIS — M545 Low back pain, unspecified: Secondary | ICD-10-CM | POA: Diagnosis not present

## 2013-10-27 DIAGNOSIS — M519 Unspecified thoracic, thoracolumbar and lumbosacral intervertebral disc disorder: Secondary | ICD-10-CM | POA: Diagnosis not present

## 2013-10-27 DIAGNOSIS — E0500 Thyrotoxicosis with diffuse goiter without thyrotoxic crisis or storm: Secondary | ICD-10-CM | POA: Diagnosis not present

## 2013-10-27 LAB — BASIC METABOLIC PANEL
BUN: 8 mg/dL (ref 6–23)
CHLORIDE: 106 meq/L (ref 96–112)
CO2: 23 mEq/L (ref 19–32)
CREATININE: 0.6 mg/dL (ref 0.4–1.2)
Calcium: 9.1 mg/dL (ref 8.4–10.5)
GFR: 128.83 mL/min (ref >60.00)
Glucose, Bld: 81 mg/dL (ref 70–99)
Potassium: 3.7 mEq/L (ref 3.5–5.1)
SODIUM: 134 meq/L — AB (ref 135–145)

## 2013-10-27 NOTE — Patient Instructions (Signed)
We will check your blood work today and try to get your records. Please go to medical records in the basement near the lab so we can get your records from [place].   Keep working with the back doctor.   We will see you back in about 6 months to check on how you are doing or sooner if you are sick. Consider working on losing a little weight to take some stress off your back and to help with your blood pressure.   We would like you to quit smoking if you are able and let us know when you are ready to try.   You Can Quit Smoking If you are ready to quit smoking or are thinking about it, congratulations! You have chosen to help yourself be healthier and live longer! There are lots of different ways to quit smoking. Nicotine gum, nicotine patches, a nicotine inhaler, or nicotine nasal spray can help with physical craving. Hypnosis, support groups, and medicines help break the habit of smoking. TIPS TO GET OFF AND STAY OFF CIGARETTES  Learn to predict your moods. Do not let a bad situation be your excuse to have a cigarette. Some situations in your life might tempt you to have a cigarette.  Ask friends and co-workers not to smoke around you.  Make your home smoke-free.  Never have "just one" cigarette. It leads to wanting another and another. Remind yourself of your decision to quit.  On a card, make a list of your reasons for not smoking. Read it at least the same number of times a day as you have a cigarette. Tell yourself everyday, "I do not want to smoke. I choose not to smoke."  Ask someone at home or work to help you with your plan to quit smoking.  Have something planned after you eat or have a cup of coffee. Take a walk or get other exercise to perk you up. This will help to keep you from overeating.  Try a relaxation exercise to calm you down and decrease your stress. Remember, you may be tense and nervous the first two weeks after you quit. This will pass.  Find new activities to  keep your hands busy. Play with a pen, coin, or rubber band. Doodle or draw things on paper.  Brush your teeth right after eating. This will help cut down the craving for the taste of tobacco after meals. You can try mouthwash too.  Try gum, breath mints, or diet candy to keep something in your mouth. IF YOU SMOKE AND WANT TO QUIT:  Do not stock up on cigarettes. Never buy a carton. Wait until one pack is finished before you buy another.  Never carry cigarettes with you at work or at home.  Keep cigarettes as far away from you as possible. Leave them with someone else.  Never carry matches or a lighter with you.  Ask yourself, "Do I need this cigarette or is this just a reflex?"  Bet with someone that you can quit. Put cigarette money in a piggy bank every morning. If you smoke, you give up the money. If you do not smoke, by the end of the week, you keep the money.  Keep trying. It takes 21 days to change a habit!  Talk to your doctor about using medicines to help you quit. These include nicotine replacement gum, lozenges, or skin patches. Document Released: 11/18/2008 Document Revised: 04/16/2011 Document Reviewed: 11/18/2008 [facility] Patient Information 2015 [place], [place]. This information is  not intended to replace advice given to you by your health care provider. Make sure you discuss any questions you have with your health care provider.

## 2013-10-27 NOTE — Progress Notes (Signed)
Pre visit review using our clinic review tool, if applicable. No additional management support is needed unless otherwise documented below in the visit note. 

## 2013-10-28 LAB — HEMOGLOBIN A1C: Hgb A1c MFr Bld: 5.3 % (ref 4.6–6.5)

## 2013-10-28 LAB — T4, FREE: FREE T4: 1.21 ng/dL (ref 0.60–1.60)

## 2013-10-28 LAB — TSH: TSH: 0.35 u[IU]/mL (ref 0.35–4.50)

## 2013-10-29 DIAGNOSIS — M519 Unspecified thoracic, thoracolumbar and lumbosacral intervertebral disc disorder: Secondary | ICD-10-CM | POA: Insufficient documentation

## 2013-10-29 DIAGNOSIS — Z6841 Body Mass Index (BMI) 40.0 and over, adult: Secondary | ICD-10-CM

## 2013-10-29 DIAGNOSIS — E0500 Thyrotoxicosis with diffuse goiter without thyrotoxic crisis or storm: Secondary | ICD-10-CM | POA: Insufficient documentation

## 2013-10-29 DIAGNOSIS — I10 Essential (primary) hypertension: Secondary | ICD-10-CM | POA: Insufficient documentation

## 2013-10-29 NOTE — Assessment & Plan Note (Signed)
Check BMP today. BP good today on lisinopril 10 mg daily and nifedipine 60 mg daily. Room to increase regimen if needed.  Filed Vitals:   10/27/13 1528  BP: 132/62  Pulse: 102  Temp: 98.1 F (36.7 C)  TempSrc: Oral  Resp: 22  Height: 5\' 4"  (1.626 m)  Weight: 281 lb 6.4 oz (127.642 kg)  SpO2: 99%

## 2013-10-29 NOTE — Assessment & Plan Note (Signed)
Trying to get records from [place] and she is seeing orthopedics. Will allow them to manage this. Talked to her about weight loss as a way to help with the strain on her back and legs. Discouraged use of wheelchair for daily activity if able to avoid permanent loss of walking.

## 2013-10-29 NOTE — Assessment & Plan Note (Signed)
Encouraged activity as able and weight reduction. She is not very functional at this time.

## 2013-10-29 NOTE — Assessment & Plan Note (Signed)
Patient on synthroid 175 mcg daily now and thinks they have changed her dose recently. Check TSH and free T4.

## 2013-10-29 NOTE — Progress Notes (Signed)
   Subjective:    Patient ID: Tonya Greene, female    DOB: July 31, 1970, 43 y.o.   MRN: [id number]  HPI The patient is a 43 YO female who is establishing care from [place]. She states that she has history of graves disease, HTN, back problems. She saw orthopedics and states that she needs a back surgery (she has put it off for some time). She is in a wheelchair and states that she is able to walk but has issues with feeling in her legs from the back injury and this limits her movement. She does have to stand for a few minutes to get feeling and does fall occasionally in the bathroom. Denies chest pains, SOB. She is having back pain.   Review of Systems  Constitutional: Negative for fever, activity change, appetite change and fatigue.  HENT: Negative.   Eyes: Negative.   Respiratory: Negative for choking, chest tightness, shortness of breath and wheezing.   Cardiovascular: Negative for chest pain, palpitations and leg swelling.  Gastrointestinal: Negative for abdominal pain, diarrhea, constipation and abdominal distention.  Endocrine: Negative.   Musculoskeletal: Positive for arthralgias, back pain, gait problem, myalgias and neck pain.  Skin: Negative.   Neurological: Negative for dizziness, syncope, weakness, light-headedness and headaches.      Objective:   Physical Exam  Constitutional: She is oriented to person, place, and time. She appears well-developed and well-nourished.  Morbidly obese  HENT:  Head: Normocephalic.  Eyes: EOM are normal.  Neck:  Hard to palpate thyroid due to body habitus  Cardiovascular: Normal rate and regular rhythm.   Pulmonary/Chest: Effort normal and breath sounds normal. No respiratory distress. She has no wheezes. She has no rales.  Abdominal: Soft. Bowel sounds are normal. She exhibits no distension. There is no tenderness. There is no rebound.  Musculoskeletal: She exhibits tenderness.  Neurological: She is alert and oriented to person, place, and  time. Coordination abnormal.  Skin: Skin is warm and dry.   Filed Vitals:   10/27/13 1528  BP: 132/62  Pulse: 102  Temp: 98.1 F (36.7 C)  TempSrc: Oral  Resp: 22  Height: 5\' 4"  (1.626 m)  Weight: 281 lb 6.4 oz (127.642 kg)  SpO2: 99%      Assessment & Plan:

## 2013-11-10 ENCOUNTER — Ambulatory Visit: Payer: Medicare Other | Attending: Orthopaedic Surgery

## 2013-11-10 DIAGNOSIS — Z5189 Encounter for other specified aftercare: Secondary | ICD-10-CM | POA: Diagnosis not present

## 2013-11-10 DIAGNOSIS — M545 Low back pain: Secondary | ICD-10-CM | POA: Insufficient documentation

## 2013-11-10 DIAGNOSIS — C73 Malignant neoplasm of thyroid gland: Secondary | ICD-10-CM | POA: Insufficient documentation

## 2013-11-10 DIAGNOSIS — I10 Essential (primary) hypertension: Secondary | ICD-10-CM | POA: Diagnosis not present

## 2013-11-12 ENCOUNTER — Telehealth: Payer: Self-pay | Admitting: *Deleted

## 2013-11-12 MED ORDER — NIFEDIPINE ER 60 MG PO TB24: 60.0000 mg | ORAL_TABLET | Freq: Every day | ORAL | Status: DC

## 2013-11-12 MED ORDER — LEVOTHYROXINE SODIUM 175 MCG PO TABS: 175.0000 ug | ORAL_TABLET | Freq: Every day | ORAL | Status: DC

## 2013-11-12 MED ORDER — RANITIDINE HCL 300 MG PO TABS
300.0000 mg | ORAL_TABLET | Freq: Two times a day (BID) | ORAL | Status: DC
Start: 2013-11-12 — End: 2015-02-28

## 2013-11-12 MED ORDER — LISINOPRIL 10 MG PO TABS: 10.0000 mg | ORAL_TABLET | Freq: Every day | ORAL | Status: DC

## 2013-11-12 NOTE — Telephone Encounter (Signed)
She told me at visit not taking and they were removed from list. She is seeing orthopedics for her back pain and they will be handling. Do not fill gabapentin and flexeril.

## 2013-11-12 NOTE — Telephone Encounter (Signed)
Notified pt with md response.../lmb 

## 2013-11-12 NOTE — Telephone Encounter (Signed)
Left msg on triage stating saw md last month needing to get refills on all her medications. Called pt back to verify meds everything on medication list, and gabapentin 300 mg tid prn, and flexeril. Inform opt will have to send msg to md because the gabapentin & flexeril is not listed on list. Is it ok to refill gabapentin & flexeril...Johny Chess

## 2013-11-26 ENCOUNTER — Telehealth: Payer: Self-pay | Admitting: Internal Medicine

## 2013-11-26 NOTE — Telephone Encounter (Signed)
Rec'd from [employer] Team forward 63 pages to Dr. Doug Sou

## 2013-11-27 ENCOUNTER — Telehealth: Payer: Self-pay | Admitting: Internal Medicine

## 2013-11-27 NOTE — Telephone Encounter (Signed)
Called back to notify

## 2013-11-27 NOTE — Telephone Encounter (Signed)
Checking to make sure you received a script for back brace and shoulder brace.

## 2013-11-27 NOTE — Telephone Encounter (Signed)
I have not seen one

## 2013-12-11 ENCOUNTER — Telehealth: Payer: Self-pay | Admitting: Internal Medicine

## 2013-12-11 NOTE — Telephone Encounter (Signed)
Checking on request for back brace and right shoulder brace.  Will refax.

## 2014-02-02 ENCOUNTER — Telehealth: Payer: Self-pay | Admitting: Internal Medicine

## 2014-02-02 NOTE — Telephone Encounter (Signed)
Eulas Post called from mobile mobility request to check on the shoulder brace Rx for pt. Please check with pt and call him back.

## 2014-02-03 NOTE — Telephone Encounter (Signed)
Have not been able to reach patient.

## 2014-04-27 ENCOUNTER — Encounter: Payer: Self-pay | Admitting: Internal Medicine

## 2014-04-27 ENCOUNTER — Other Ambulatory Visit (INDEPENDENT_AMBULATORY_CARE_PROVIDER_SITE_OTHER): Payer: Medicare Other

## 2014-04-27 ENCOUNTER — Ambulatory Visit (INDEPENDENT_AMBULATORY_CARE_PROVIDER_SITE_OTHER): Payer: Medicare Other | Admitting: Internal Medicine

## 2014-04-27 VITALS — BP 112/68 | HR 78 | Temp 98.3°F | Resp 16 | Ht 64.0 in | Wt 287.1 lb

## 2014-04-27 DIAGNOSIS — Z6841 Body Mass Index (BMI) 40.0 and over, adult: Secondary | ICD-10-CM

## 2014-04-27 DIAGNOSIS — N926 Irregular menstruation, unspecified: Secondary | ICD-10-CM | POA: Diagnosis not present

## 2014-04-27 DIAGNOSIS — Z3200 Encounter for pregnancy test, result unknown: Secondary | ICD-10-CM | POA: Diagnosis not present

## 2014-04-27 DIAGNOSIS — E039 Hypothyroidism, unspecified: Secondary | ICD-10-CM

## 2014-04-27 LAB — TSH: TSH: 0.53 u[IU]/mL (ref 0.35–4.50)

## 2014-04-27 LAB — POCT URINE PREGNANCY: PREG TEST UR: NEGATIVE

## 2014-04-27 NOTE — Patient Instructions (Signed)
We will check on the thyroid today. The biggest thing we are working on is the weight loss. This is the most important thing right now. The weight is hurting your knees and back more and more every day. 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   You need to find a way to exercise even if it is in your home. Medicare will pay for gym and you can try water aerobics which is easier on the joints. We do also recommend that you meet with a nutritionist to make sure that you are optimizing the food that you are eating and do what we can to help you.   Serving Sizes What we call a serving size today is larger than it was in the past. A 1950s fast-food burger contained little more than 1 oz of meat, and a soft drink was 8 oz (1 cup). Today, a "quarter pounder" burger is at least 4 times that amount, and a 32 or 64 oz drink is not uncommon. A possible guide for eating when trying to lose weight is to eat about half as much as you normally do. Some estimates of serving sizes are:  1 Dairy serving:Individual container of yogurt (8 oz) or piece of cheese the size of your thumb (1 oz).  1 Grain serving: 1 slice of bread or  cup pasta.  1 Meat serving: The size of a deck of cards (3 oz).  1 Fruit serving: cup canned fruit or 1 medium fruit.  1 Vegetable serving:  cup of cooked or canned vegetables.  1 Fat serving:The size of 4 stacked dimes. Experts suggest spending 1 or 2 days measuring food portions you commonly eat. This will give you better practice at estimating serving sizes, and will also show whether you are eating an appropriate amount of food to meet your weight goals. If you find that you are eating more than you thought, try measuring your food for a few days so you can "reprogram" yourself to learn what makes a healthy portion for you. SUGGESTIONS FOR CONTROL  In  restaurants, share entrees, or ask the waiter to put half the entre in a box or bag before you even touch it.  Order lunch-sized portions. Many restaurants serve 4 to 6 oz of meat at lunch, compared with 8 to 10 oz at dinner.  Split dessert or skip it all together. Have a piece of fruit when you get home.  At home, use smaller plates and bowls. It will look as if you are eating more.  Plate your food in the kitchen rather than serving it "family style" at the table.  Wait 20 to 30 minutes before taking seconds. This is how long it takes your brain to recognize that you are full.  Check food labels for serving sizes. Eat 1 serving only.  Use measuring cups and spoons to see proper serving sizes.  Buy smaller packages of candy, popcorn, and snacks.  Avoid eating directly out of the bag or carton.  While eating half as much, exercise twice as much. Park further away from the mall, take the stairs instead of the escalator, and walk around your block. Losing weight is a slow, difficult process. It takes long-lasting lifestyle changes. You can make gradual changes over time so they become habits. Look to friends and family to support the healthy  changes you are making. Avoid fad diets since they are often only temporary weight loss solutions. Document Released: 10/21/2002 Document Revised: 04/16/2011 Document Reviewed: 04/21/2013 [facility] Patient Information 2015 [place], [place]. This information is not intended to replace advice given to you by your health care provider. Make sure you discuss any questions you have with your health care provider.

## 2014-04-27 NOTE — Progress Notes (Signed)
Pre visit review using our clinic review tool, if applicable. No additional management support is needed unless otherwise documented below in the visit note. 

## 2014-04-29 ENCOUNTER — Other Ambulatory Visit: Payer: Self-pay | Admitting: Internal Medicine

## 2014-04-29 DIAGNOSIS — N926 Irregular menstruation, unspecified: Secondary | ICD-10-CM | POA: Insufficient documentation

## 2014-04-29 MED ORDER — CYCLOBENZAPRINE HCL 5 MG PO TABS: 5.0000 mg | ORAL_TABLET | Freq: Three times a day (TID) | ORAL | Status: DC | PRN

## 2014-04-29 MED ORDER — NAPROXEN 500 MG PO TABS: 500.0000 mg | ORAL_TABLET | Freq: Two times a day (BID) | ORAL | Status: DC

## 2014-04-29 NOTE — Assessment & Plan Note (Signed)
POCT pregnancy negative in the office. Talked with her about perimenopausal that she is likely not ovulation regularly and periods can be irregular for several years before you are in true menopause. Advised her that she can still get pregnant and advised that she always use some form of birth control.

## 2014-04-29 NOTE — Progress Notes (Signed)
   Subjective:    Patient ID: Tonya Greene, female    DOB: [birth year], 44 y.o.   MRN: [id number]  HPI The patient is a 44 YO female who is coming in to follow up on her weight. She has not been able to lose any weight since last time. She feels her eating is okay. Her pain limits her activity. Has not been exercising at all. Weight has been a lifelong issue for her. Overall she does not feel very motivated or empowered to change right now. She is also concerned that she has not gotten her period for 1-2 months and is sexually active. She thinks she may be perimenopausal but wants to make sure she is not pregnant. Not using protection consistently.   Review of Systems  Constitutional: Negative for fever, activity change, appetite change and fatigue.  HENT: Negative.   Eyes: Negative.   Respiratory: Negative for choking, chest tightness, shortness of breath and wheezing.   Cardiovascular: Negative for chest pain, palpitations and leg swelling.  Gastrointestinal: Negative for abdominal pain, diarrhea, constipation and abdominal distention.  Endocrine: Negative.   Genitourinary:       Missed period  Musculoskeletal: Positive for myalgias, back pain, arthralgias, gait problem and neck pain.  Skin: Negative.   Neurological: Negative for dizziness, syncope, weakness, light-headedness and headaches.      Objective:   Physical Exam  Constitutional: She is oriented to person, place, and time. She appears well-developed and well-nourished.  Morbidly obese  HENT:  Head: Normocephalic.  Eyes: EOM are normal.  Cardiovascular: Normal rate and regular rhythm.   Pulmonary/Chest: Effort normal and breath sounds normal. No respiratory distress. She has no wheezes. She has no rales.  Abdominal: Soft. Bowel sounds are normal. She exhibits no distension. There is no tenderness. There is no rebound.  Musculoskeletal: She exhibits tenderness.  Neurological: She is alert and oriented to person, place, and  time. Coordination abnormal.  Skin: Skin is warm and dry.   Filed Vitals:   04/27/14 1312  BP: 112/68  Pulse: 78  Temp: 98.3 F (36.8 C)  TempSrc: Oral  Resp: 16  Height: 5\' 4"  (1.626 m)  Weight: 287 lb 1.9 oz (130.237 kg)  SpO2: 99%      Assessment & Plan:

## 2014-04-29 NOTE — Assessment & Plan Note (Signed)
Weight is up 6 pounds since last visit and talked to her about the fact that she needs to start moving otherwise she will not be able to move anymore. She is already limited in her mobility at times. She refused to meet with a dietician stating that she has a good diet. Informed her that [employer] will pay for her to go to a gym and that water aerobics would be easier on her joints that traditional exercise.

## 2014-07-12 ENCOUNTER — Other Ambulatory Visit: Payer: Self-pay | Admitting: Internal Medicine

## 2014-07-18 ENCOUNTER — Encounter (HOSPITAL_COMMUNITY): Payer: Self-pay | Admitting: Emergency Medicine

## 2014-07-18 ENCOUNTER — Emergency Department (HOSPITAL_COMMUNITY)
Admission: EM | Admit: 2014-07-18 | Discharge: 2014-07-18 | Disposition: A | Discharge status: Home / Self Care | Payer: Medicare Other | Attending: Emergency Medicine | Admitting: Emergency Medicine

## 2014-07-18 DIAGNOSIS — Z79899 Other long term (current) drug therapy: Secondary | ICD-10-CM | POA: Insufficient documentation

## 2014-07-18 DIAGNOSIS — Z85850 Personal history of malignant neoplasm of thyroid: Secondary | ICD-10-CM | POA: Diagnosis not present

## 2014-07-18 DIAGNOSIS — L309 Dermatitis, unspecified: Secondary | ICD-10-CM | POA: Insufficient documentation

## 2014-07-18 DIAGNOSIS — Z8659 Personal history of other mental and behavioral disorders: Secondary | ICD-10-CM | POA: Diagnosis not present

## 2014-07-18 DIAGNOSIS — R21 Rash and other nonspecific skin eruption: Secondary | ICD-10-CM

## 2014-07-18 DIAGNOSIS — M1990 Unspecified osteoarthritis, unspecified site: Secondary | ICD-10-CM | POA: Diagnosis not present

## 2014-07-18 DIAGNOSIS — E0500 Thyrotoxicosis with diffuse goiter without thyrotoxic crisis or storm: Secondary | ICD-10-CM | POA: Diagnosis not present

## 2014-07-18 DIAGNOSIS — I10 Essential (primary) hypertension: Secondary | ICD-10-CM | POA: Diagnosis not present

## 2014-07-18 DIAGNOSIS — Z791 Long term (current) use of non-steroidal anti-inflammatories (NSAID): Secondary | ICD-10-CM | POA: Insufficient documentation

## 2014-07-18 DIAGNOSIS — Z720 Tobacco use: Secondary | ICD-10-CM | POA: Insufficient documentation

## 2014-07-18 MED ORDER — PREDNISONE 20 MG PO TABS
60.0000 mg | ORAL_TABLET | Freq: Once | ORAL | Status: AC
  Administered 2014-07-18: 60 mg via ORAL
  Filled 2014-07-18: qty 3

## 2014-07-18 MED ORDER — HYDROCORTISONE 1 % EX CREA: 1.0000 "application " | TOPICAL_CREAM | Freq: Two times a day (BID) | CUTANEOUS | Status: DC

## 2014-07-18 MED ORDER — PREDNISONE 20 MG PO TABS: 40.0000 mg | ORAL_TABLET | Freq: Every day | ORAL | Status: DC

## 2014-07-18 MED ORDER — FAMOTIDINE 20 MG PO TABS
40.0000 mg | ORAL_TABLET | Freq: Once | ORAL | Status: AC
  Administered 2014-07-18: 40 mg via ORAL
  Filled 2014-07-18: qty 2

## 2014-07-18 MED ORDER — DIPHENHYDRAMINE HCL 25 MG PO TABS: 50.0000 mg | ORAL_TABLET | ORAL | Status: DC | PRN

## 2014-07-18 MED ORDER — DIPHENHYDRAMINE HCL 25 MG PO CAPS
50.0000 mg | ORAL_CAPSULE | Freq: Once | ORAL | Status: AC
  Administered 2014-07-18: 50 mg via ORAL
  Filled 2014-07-18: qty 2

## 2014-07-18 NOTE — ED Notes (Signed)
Pt. Stated, I've had a ras for month half started out with a little bump and it continues to spread. It itches and it hurts

## 2014-07-18 NOTE — ED Notes (Signed)
Declined W/C at D/C and was escorted to lobby by RN. 

## 2014-07-18 NOTE — Discharge Instructions (Signed)
Contact Dermatitis Contact dermatitis is a reaction to certain substances that touch the skin. Contact dermatitis can be either irritant contact dermatitis or allergic contact dermatitis. Irritant contact dermatitis does not require previous exposure to the substance for a reaction to occur.Allergic contact dermatitis only occurs if you have been exposed to the substance before. Upon a repeat exposure, your body reacts to the substance.  CAUSES  Many substances can cause contact dermatitis. Irritant dermatitis is most commonly caused by repeated exposure to mildly irritating substances, such as:  Makeup.  Soaps.  Detergents.  Bleaches.  Acids.  Metal salts, such as nickel. Allergic contact dermatitis is most commonly caused by exposure to:  Poisonous plants.  Chemicals (deodorants, shampoos).  Jewelry.  Latex.  Neomycin in triple antibiotic cream.  Preservatives in products, including clothing. SYMPTOMS  The area of skin that is exposed may develop:  Dryness or flaking.  Redness.  Cracks.  Itching.  Pain or a burning sensation.  Blisters. With allergic contact dermatitis, there may also be swelling in areas such as the eyelids, mouth, or genitals.  DIAGNOSIS  Your caregiver can usually tell what the problem is by doing a physical exam. In cases where the cause is uncertain and an allergic contact dermatitis is suspected, a patch skin test may be performed to help determine the cause of your dermatitis. TREATMENT Treatment includes protecting the skin from further contact with the irritating substance by avoiding that substance if possible. Barrier creams, powders, and gloves may be helpful. Your caregiver may also recommend:  Steroid creams or ointments applied 2 times daily. For best results, soak the rash area in cool water for 20 minutes. Then apply the medicine. Cover the area with a plastic wrap. You can store the steroid cream in the refrigerator for a "chilly"  effect on your rash. That may decrease itching. Oral steroid medicines may be needed in more severe cases.  Antibiotics or antibacterial ointments if a skin infection is present.  Antihistamine lotion or an antihistamine taken by mouth to ease itching.  Lubricants to keep moisture in your skin.  Burow's solution to reduce redness and soreness or to dry a weeping rash. Mix one packet or tablet of solution in 2 cups cool water. Dip a clean washcloth in the mixture, wring it out a bit, and put it on the affected area. Leave the cloth in place for 30 minutes. Do this as often as possible throughout the day.  Taking several cornstarch or baking soda baths daily if the area is too large to cover with a washcloth. Harsh chemicals, such as alkalis or acids, can cause skin damage that is like a burn. You should flush your skin for 15 to 20 minutes with cold water after such an exposure. You should also seek immediate medical care after exposure. Bandages (dressings), antibiotics, and pain medicine may be needed for severely irritated skin.  HOME CARE INSTRUCTIONS  Avoid the substance that caused your reaction.  Keep the area of skin that is affected away from hot water, soap, sunlight, chemicals, acidic substances, or anything else that would irritate your skin.  Do not scratch the rash. Scratching may cause the rash to become infected.  You may take cool baths to help stop the itching.  Only take over-the-counter or prescription medicines as directed by your caregiver.  See your caregiver for follow-up care as directed to make sure your skin is healing properly. SEEK MEDICAL CARE IF:   Your condition is not better after 3  days of treatment.  You seem to be getting worse.  You see signs of infection such as swelling, tenderness, redness, soreness, or warmth in the affected area.  You have any problems related to your medicines. Document Released: 01/20/2000 Document Revised: 04/16/2011  Document Reviewed: 06/27/2010 [facility] Patient Information 2015 [place], [place]. This information is not intended to replace advice given to you by your health care provider. Make sure you discuss any questions you have with your health care provider.  Eczema Eczema, also called atopic dermatitis, is a skin disorder that causes inflammation of the skin. It causes a red rash and dry, scaly skin. The skin becomes very itchy. Eczema is generally worse during the cooler winter months and often improves with the warmth of summer. Eczema usually starts showing signs in infancy. Some children outgrow eczema, but it may last through adulthood.  CAUSES  The exact cause of eczema is not known, but it appears to run in families. People with eczema often have a family history of eczema, allergies, asthma, or hay fever. Eczema is not contagious. Flare-ups of the condition may be caused by:   Contact with something you are sensitive or allergic to.   Stress. SIGNS AND SYMPTOMS  Dry, scaly skin.   Red, itchy rash.   Itchiness. This may occur before the skin rash and may be very intense.  DIAGNOSIS  The diagnosis of eczema is usually made based on symptoms and medical history. TREATMENT  Eczema cannot be cured, but symptoms usually can be controlled with treatment and other strategies. A treatment plan might include:  Controlling the itching and scratching.   Use over-the-counter antihistamines as directed for itching. This is especially useful at night when the itching tends to be worse.   Use over-the-counter steroid creams as directed for itching.   Avoid scratching. Scratching makes the rash and itching worse. It may also result in a skin infection (impetigo) due to a break in the skin caused by scratching.   Keeping the skin well moisturized with creams every day. This will seal in moisture and help prevent dryness. Lotions that contain alcohol and water should be avoided because they  can dry the skin.   Limiting exposure to things that you are sensitive or allergic to (allergens).   Recognizing situations that cause stress.   Developing a plan to manage stress.  HOME CARE INSTRUCTIONS   Only take over-the-counter or prescription medicines as directed by your health care provider.   Do not use anything on the skin without checking with your health care provider.   Keep baths or showers short (5 minutes) in warm (not hot) water. Use mild cleansers for bathing. These should be unscented. You may add nonperfumed bath oil to the bath water. It is best to avoid soap and bubble bath.   Immediately after a bath or shower, when the skin is still damp, apply a moisturizing ointment to the entire body. This ointment should be a petroleum ointment. This will seal in moisture and help prevent dryness. The thicker the ointment, the better. These should be unscented.   Keep fingernails cut short. Children with eczema may need to wear soft gloves or mittens at night after applying an ointment.   Dress in clothes made of cotton or cotton blends. Dress lightly, because heat increases itching.   A child with eczema should stay away from anyone with fever blisters or cold sores. The virus that causes fever blisters (herpes simplex) can cause a serious skin infection  in children with eczema. SEEK MEDICAL CARE IF:   Your itching interferes with sleep.   Your rash gets worse or is not better within 1 week after starting treatment.   You see pus or soft yellow scabs in the rash area.   You have a fever.   You have a rash flare-up after contact with someone who has fever blisters.  Document Released: 01/20/2000 Document Revised: 11/12/2012 Document Reviewed: 08/25/2012 [facility] Patient Information 2015 [place], [place]. This information is not intended to replace advice given to you by your health care provider. Make sure you discuss any questions you have with your health  care provider.  Rash A rash is a change in the color or texture of your skin. There are many different types of rashes. You may have other problems that accompany your rash. CAUSES   Infections.  Allergic reactions. This can include allergies to pets or foods.  Certain medicines.  Exposure to certain chemicals, soaps, or cosmetics.  Heat.  Exposure to poisonous plants.  Tumors, both cancerous and noncancerous. SYMPTOMS   Redness.  Scaly skin.  Itchy skin.  Dry or cracked skin.  Bumps.  Blisters.  Pain. DIAGNOSIS  Your caregiver may do a physical exam to determine what type of rash you have. A skin sample (biopsy) may be taken and examined under a microscope. TREATMENT  Treatment depends on the type of rash you have. Your caregiver may prescribe certain medicines. For serious conditions, you may need to see a skin doctor (dermatologist). HOME CARE INSTRUCTIONS   Avoid the substance that caused your rash.  Do not scratch your rash. This can cause infection.  You may take cool baths to help stop itching.  Only take over-the-counter or prescription medicines as directed by your caregiver.  Keep all follow-up appointments as directed by your caregiver. SEEK IMMEDIATE MEDICAL CARE IF:  You have increasing pain, swelling, or redness.  You have a fever.  You have new or severe symptoms.  You have body aches, diarrhea, or vomiting.  Your rash is not better after 3 days. MAKE SURE YOU:  Understand these instructions.  Will watch your condition.  Will get help right away if you are not doing well or get worse. Document Released: 01/12/2002 Document Revised: 04/16/2011 Document Reviewed: 11/06/2010 [facility] Patient Information 2015 [place], [place]. This information is not intended to replace advice given to you by your health care provider. Make sure you discuss any questions you have with your health care provider.

## 2014-07-18 NOTE — ED Provider Notes (Signed)
CSN: [id number]     Arrival date & time 07/18/14  1125 History  This chart was scribed for Delsa Grana, PA-C, working with Nat Christen, MD by Steva Colder, ED Scribe. The patient was seen in room TR06C/TR06C at 12:36 PM.    Chief Complaint  Patient presents with  . Rash   The history is provided by the patient. No language interpreter was used.   HPI Comments: Tonya Greene is a 44 y.o. 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 It began as a small bump, described as a patch, on her right hand 4 months ago and it has gradually spread since then.  At first the patch on her right hand gradually became larger with some scaling and cracking redness around the border.  It then gradually spread all over her upper extremities, lower extremities and a few areas on her abdomen. The rash is itchy, but she has not tried anything to treat it. Sometimes the larger confluent rash areas crack, causing more pain and minor bleeding, however she has not had any signs of infection such as progressive edema, streaking erythema, or purulent exudate.  Her skin is more itchy when exposed to heat, the sun or after taking a shower.   She states that she has not tried topical ointments or benadryl for the relief of her symptoms. She has used ASAP silver sol gel for the relief of her symptoms. She denies nasal drainage, cold symptoms, and any other symptoms. Pt is taking zyrtec for her allergies. She denies any medication changes. She notes that she used a new soap prior to her symptoms. She denies eczema in the past. Denies hx of lupus.  She complains of coldness and pallor intermittently to her fingers.  Past Medical History  Diagnosis Date  . Thyroid cancer   . Depression   . Muscle spasms of both lower extremities   . Disc disease with myelopathy, lumbar    . Arthritis   . Hypertension    Past Surgical History  Procedure Laterality Date  . Colonoscopy      repair of fissures  . Glands      under arms-removed   Family History  Problem Relation Age of Onset  . Diabetes Mother   . Hypertension Mother   . Diabetes Father   . Hypertension Father   . Heart failure Father    History  Substance Use Topics  . Smoking status: Current Every Day Smoker    Types: Cigarettes  . Smokeless tobacco: Not on file  . Alcohol Use: Yes   OB History    No data available     Review of Systems  Constitutional: Negative for fever and chills.  HENT: Negative for congestion and rhinorrhea.   Skin: Positive for color change and rash.   Allergies  Review of patient's allergies indicates no known allergies.  Home Medications   Prior to Admission medications   Medication Sig Start Date End Date Taking? Authorizing Provider  cetirizine (ZYRTEC) 10 MG tablet TAKE 1 TABLET BY MOUTH DAILY 07/12/14   Olga Millers, MD  cyclobenzaprine (FLEXERIL) 5 MG tablet Take 1 tablet (5 mg total) by mouth 3 (three) times daily as needed for muscle spasms. 04/29/14   Olga Millers, MD  levothyroxine (SYNTHROID, LEVOTHROID) 175 MCG tablet TAKE 1 TABLET BY MOUTH DAILY  BEFORE BREAKFAST 07/12/14   Olga Millers, MD  lisinopril (PRINIVIL,ZESTRIL) 10 MG tablet Take 1 tablet (10 mg total) by mouth daily. 11/12/13   Olga Millers, MD  naproxen (NAPROSYN) 500 MG tablet Take 1 tablet (500 mg total) by mouth 2 (two) times daily with a meal. 04/29/14   Olga Millers, MD  NIFEdipine (PROCARDIA-XL/ADALAT CC) 60 MG 24 hr tablet Take 1 tablet (60 mg total) by mouth daily. 11/12/13   Olga Millers, MD  ranitidine (ZANTAC) 300 MG tablet Take 1 tablet (300 mg total) by mouth 2 (two) times daily. 11/12/13   Olga Millers, MD   LMP 07/16/2014  Filed Vitals:   07/18/14 1345  BP: 137/98  Pulse: 92  Temp: 97.5 F (36.4 C)  Resp: 16   Physical Exam   Constitutional: She is oriented to person, place, and time. She appears well-developed and well-nourished. No distress.  HENT:  Head: Normocephalic and atraumatic.  Mouth/Throat: Uvula is midline, oropharynx is clear and moist and mucous membranes are normal. Mucous membranes are not pale, not dry and not cyanotic. No oral lesions. No uvula swelling. No posterior oropharyngeal edema or posterior oropharyngeal erythema.  Eyes: EOM are normal.  Neck: Neck supple. No tracheal deviation present.  Cardiovascular: Normal rate, regular rhythm and normal heart sounds.  Exam reveals no gallop and no friction rub.   No murmur heard. Pulmonary/Chest: Effort normal and breath sounds normal. No accessory muscle usage. No tachypnea. No respiratory distress. She has no decreased breath sounds. She has no wheezes. She has no rhonchi. She has no rales.  Musculoskeletal: Normal range of motion.  Neurological: She is alert and oriented to person, place, and time.  Skin: Skin is warm and dry. Rash noted. No abrasion, no bruising, no ecchymosis and no laceration noted. Rash is macular, papular and maculopapular. She is not diaphoretic. No cyanosis. No pallor. Nails show no clubbing.     Diffuse erythematous plaques and dry patches (not scaly or silver)  Largest area approximately 10x7cm on dorsal aspect of right hand, with cracks.  No edema, no exudate, skin very flaky and dry  Psychiatric: She has a normal mood and affect. Her behavior is normal.  Nursing note and vitals reviewed.   ED Course  Procedures (including critical care time)  COORDINATION OF CARE: 12:47 PM-Discussed treatment plan which includes benadryl, steroids, pepcid, antihistamine rx, steroid Rx, f/u with PCP Friday, referral to dermatologist, with pt at bedside and pt agreed to plan.   Labs Review Labs Reviewed - No data to display  Imaging Review No results found.   EKG Interpretation None      MDM   Final diagnoses:  None     Rash:  Puritic, gradually progressing rash over large body surface area, worse on UE, but also found on LE and abdomen.   Rash ddx SLE? With other sxs, malar rash, increased puritis with heat, raynaud's, sclerodactyly finger appearance vs plaque psoriasis vs eczema exacerbation.  The slow progressive nature of the rash makes a contact dermatitis or allergic reaction less likely, and with patches and plaques do not suspect that it is bug bites.    Patient denies any difficulty breathing or swallowing.  Pt has a patent airway without stridor and is handling secretions without difficulty; no angioedema. No blisters, no pustules, no warmth, no draining sinus tracts, no superficial abscesses, no bullous impetigo, no vesicles, no desquamation, no target lesions with dusky purpura or a central bulla. Not tender to  touch. No concern for superimposed infection. No concern for SJS, TEN, TSS, tick borne illness, syphilis or other life-threatening condition. Will discharge home with short course of steroids, pepcid and recommend Benadryl as needed for pruritis.  Topical cortisone prescribed for home treatment, pt encouraged to work up possible rhuematologic/autoimmune etiologies with PCP or seek referral to dermatology.   Pt in agreeance at this time, as well as family member in room who helps provide care for her and transportation.  Pt stable to discharge at this time.  Medications  diphenhydrAMINE (BENADRYL) capsule 50 mg (50 mg Oral Given 07/18/14 1334)  famotidine (PEPCID) tablet 40 mg (40 mg Oral Given 07/18/14 1334)  predniSONE (DELTASONE) tablet 60 mg (60 mg Oral Given 07/18/14 1334)   Filed Vitals:   07/18/14 1345  BP: 137/98  Pulse: 92  Temp: 97.5 F (36.4 C)  TempSrc: Oral  Resp: 16  SpO2: 98%   Delsa Grana, PA-C   I personally performed the services described in this documentation, which was scribed in my presence. The recorded information has been reviewed and is accurate.    Delsa Grana, PA-C 07/22/14 [place], MD 07/24/14 1053

## 2014-07-23 ENCOUNTER — Other Ambulatory Visit: Payer: Medicare Other

## 2014-07-23 ENCOUNTER — Ambulatory Visit (INDEPENDENT_AMBULATORY_CARE_PROVIDER_SITE_OTHER): Payer: Medicare Other | Admitting: Internal Medicine

## 2014-07-23 ENCOUNTER — Encounter: Payer: Self-pay | Admitting: Internal Medicine

## 2014-07-23 VITALS — BP 112/82 | HR 89 | Temp 98.1°F | Resp 18 | Wt 286.1 lb

## 2014-07-23 DIAGNOSIS — R21 Rash and other nonspecific skin eruption: Secondary | ICD-10-CM | POA: Diagnosis not present

## 2014-07-23 MED ORDER — TRIAMCINOLONE ACETONIDE 0.1 % EX CREA: 1.0000 "application " | TOPICAL_CREAM | Freq: Two times a day (BID) | CUTANEOUS | Status: DC

## 2014-07-23 NOTE — Assessment & Plan Note (Signed)
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 Checking ANA today. Triamcinolone cream for the rash. Oral prednisone has not helped significantly. Can keep using benadryl for itching and advised to not scratch.

## 2014-07-23 NOTE — Progress Notes (Signed)
Pre visit review using our clinic review tool, if applicable. No additional management support is needed unless otherwise documented below in the visit note. 

## 2014-07-23 NOTE — Progress Notes (Signed)
   Subjective:    Patient ID: Tonya Greene, female    DOB: 12-05-1970, 44 y.o.   MRN: [id number]  HPI The patient is a 44 YO female coming in with rash on her body for 4-5 months. Has gotten worse over the last 3 months although she only sought care for it in the ER last week. They started her on prednisone, benadryl, hydrocortisone cream. She is having mild if any improvement since that time. No fevers, chills, weight loss. Is having itching and is scratching some. Some family history of lupus. Does not notice that it is affected by sun.   Review of Systems  Constitutional: Negative for fever, activity change, appetite change, fatigue and unexpected weight change.  Respiratory: Negative.   Cardiovascular: Negative.   Gastrointestinal: Negative.   Skin: Positive for color change and rash. Negative for pallor and wound.      Objective:   Physical Exam  Constitutional: She appears well-developed and well-nourished.  Morbidly obese  HENT:  Head: Normocephalic and atraumatic.  Cardiovascular: Normal rate and regular rhythm.   Pulmonary/Chest: Effort normal.  Abdominal: Soft.  Skin: Skin is warm and dry.  Patches of raised red rashes on the arms and legs. Differing sizes. None appear infected.    Filed Vitals:   07/23/14 1104  BP: 112/82  Pulse: 89  Temp: 98.1 F (36.7 C)  TempSrc: Oral  Resp: 18  Weight: 286 lb 1.9 oz (129.783 kg)  SpO2: 99%      Assessment & Plan:

## 2014-07-23 NOTE — Patient Instructions (Signed)
We have sent in triamcinolone cream for the rash. Do not use it on your face. Use it twice a day on the rash.   We will check the blood work for any signs of the lupus and call you back with the results.   If the rash is not better in 1-2 weeks call us and we will send you to the dermatologist.   Rash A rash is a change in the color or texture of your skin. There are many different types of rashes. You may have other problems that accompany your rash. CAUSES   Infections.  Allergic reactions. This can include allergies to pets or foods.  Certain medicines.  Exposure to certain chemicals, soaps, or cosmetics.  Heat.  Exposure to poisonous plants.  Tumors, both cancerous and noncancerous. SYMPTOMS   Redness.  Scaly skin.  Itchy skin.  Dry or cracked skin.  Bumps.  Blisters.  Pain. DIAGNOSIS  Your caregiver may do a physical exam to determine what type of rash you have. A skin sample (biopsy) may be taken and examined under a microscope. TREATMENT  Treatment depends on the type of rash you have. Your caregiver may prescribe certain medicines. For serious conditions, you may need to see a skin doctor (dermatologist). HOME CARE INSTRUCTIONS   Avoid the substance that caused your rash.  Do not scratch your rash. This can cause infection.  You may take cool baths to help stop itching.  Only take over-the-counter or prescription medicines as directed by your caregiver.  Keep all follow-up appointments as directed by your caregiver. SEEK IMMEDIATE MEDICAL CARE IF:  You have increasing pain, swelling, or redness.  You have a fever.  You have new or severe symptoms.  You have body aches, diarrhea, or vomiting.  Your rash is not better after 3 days. MAKE SURE YOU:  Understand these instructions.  Will watch your condition.  Will get help right away if you are not doing well or get worse. Document Released: 01/12/2002 Document Revised: 04/16/2011 Document  Reviewed: 11/06/2010 [facility] Patient Information 2015 [place], [place]. This information is not intended to replace advice given to you by your health care provider. Make sure you discuss any questions you have with your health care provider.

## 2014-07-26 LAB — ANA: Anti Nuclear Antibody(ANA): NEGATIVE

## 2014-08-02 ENCOUNTER — Telehealth: Payer: Self-pay | Admitting: Internal Medicine

## 2014-08-02 NOTE — Telephone Encounter (Signed)
Patient is calling for the resuts of her lab work

## 2014-08-05 NOTE — Telephone Encounter (Signed)
Left message for patient to call me back. 

## 2014-08-05 NOTE — Telephone Encounter (Signed)
Patient has questions re: lab results. Please give her a call. Thank you.

## 2014-08-30 ENCOUNTER — Other Ambulatory Visit: Payer: Self-pay | Admitting: Internal Medicine

## 2014-10-07 ENCOUNTER — Other Ambulatory Visit: Payer: Self-pay | Admitting: Internal Medicine

## 2014-11-05 ENCOUNTER — Other Ambulatory Visit: Payer: Self-pay | Admitting: Internal Medicine

## 2014-11-25 ENCOUNTER — Other Ambulatory Visit: Payer: Self-pay

## 2014-11-25 DIAGNOSIS — Z1231 Encounter for screening mammogram for malignant neoplasm of breast: Secondary | ICD-10-CM

## 2014-12-08 ENCOUNTER — Other Ambulatory Visit: Payer: Self-pay | Admitting: Internal Medicine

## 2014-12-29 ENCOUNTER — Ambulatory Visit
Admission: RE | Admit: 2014-12-29 | Discharge: 2014-12-29 | Disposition: A | Discharge status: Home / Self Care | Payer: Medicare Other | Source: Ambulatory Visit

## 2014-12-29 ENCOUNTER — Other Ambulatory Visit: Payer: Self-pay

## 2014-12-29 DIAGNOSIS — Z1231 Encounter for screening mammogram for malignant neoplasm of breast: Secondary | ICD-10-CM | POA: Diagnosis not present

## 2015-01-04 ENCOUNTER — Other Ambulatory Visit: Payer: Self-pay | Admitting: Internal Medicine

## 2015-01-04 DIAGNOSIS — R928 Other abnormal and inconclusive findings on diagnostic imaging of breast: Secondary | ICD-10-CM

## 2015-01-12 ENCOUNTER — Other Ambulatory Visit: Payer: Medicare Other

## 2015-01-13 ENCOUNTER — Other Ambulatory Visit: Payer: Self-pay | Admitting: Internal Medicine

## 2015-01-18 ENCOUNTER — Ambulatory Visit
Admission: RE | Admit: 2015-01-18 | Discharge: 2015-01-18 | Disposition: A | Discharge status: Home / Self Care | Payer: Medicare Other | Source: Ambulatory Visit | Attending: Internal Medicine | Admitting: Internal Medicine

## 2015-01-18 DIAGNOSIS — R928 Other abnormal and inconclusive findings on diagnostic imaging of breast: Secondary | ICD-10-CM

## 2015-01-18 DIAGNOSIS — N63 Unspecified lump in breast: Secondary | ICD-10-CM | POA: Diagnosis not present

## 2015-01-28 ENCOUNTER — Other Ambulatory Visit: Payer: Self-pay | Admitting: Internal Medicine

## 2015-02-28 ENCOUNTER — Other Ambulatory Visit: Payer: Self-pay | Admitting: Internal Medicine

## 2015-04-04 ENCOUNTER — Other Ambulatory Visit: Payer: Self-pay | Admitting: Internal Medicine

## 2015-04-12 ENCOUNTER — Ambulatory Visit (INDEPENDENT_AMBULATORY_CARE_PROVIDER_SITE_OTHER): Payer: Medicare Other | Admitting: Internal Medicine

## 2015-04-12 ENCOUNTER — Encounter: Payer: Self-pay | Admitting: Internal Medicine

## 2015-04-12 ENCOUNTER — Other Ambulatory Visit (INDEPENDENT_AMBULATORY_CARE_PROVIDER_SITE_OTHER): Payer: Medicare Other

## 2015-04-12 VITALS — BP 122/78 | HR 118 | Temp 98.3°F | Resp 18 | Ht 65.0 in | Wt 282.0 lb

## 2015-04-12 DIAGNOSIS — M25561 Pain in right knee: Secondary | ICD-10-CM | POA: Diagnosis not present

## 2015-04-12 DIAGNOSIS — M67471 Ganglion, right ankle and foot: Secondary | ICD-10-CM | POA: Diagnosis not present

## 2015-04-12 DIAGNOSIS — R7301 Impaired fasting glucose: Secondary | ICD-10-CM

## 2015-04-12 LAB — COMPREHENSIVE METABOLIC PANEL
ALT: 27 U/L (ref 0–35)
AST: 27 U/L (ref 0–37)
Albumin: 4.4 g/dL (ref 3.5–5.2)
Alkaline Phosphatase: 81 U/L (ref 39–117)
BILIRUBIN TOTAL: 0.3 mg/dL (ref 0.2–1.2)
BUN: 7 mg/dL (ref 6–23)
CALCIUM: 9.7 mg/dL (ref 8.4–10.5)
CO2: 23 mEq/L (ref 19–32)
Chloride: 104 mEq/L (ref 96–112)
Creatinine, Ser: 0.62 mg/dL (ref 0.40–1.20)
GFR: 132.79 mL/min (ref >60.00)
Glucose, Bld: 81 mg/dL (ref 70–99)
Potassium: 4.5 mEq/L (ref 3.5–5.1)
Sodium: 138 mEq/L (ref 135–145)
Total Protein: 7.3 g/dL (ref 6.0–8.3)

## 2015-04-12 LAB — MAGNESIUM: Magnesium: 2.1 mg/dL (ref 1.5–2.5)

## 2015-04-12 LAB — HEMOGLOBIN A1C: HEMOGLOBIN A1C: 5.3 % (ref 4.6–6.5)

## 2015-04-12 NOTE — Progress Notes (Signed)
   Subjective:    Patient ID: Tonya Greene, female    DOB: 05-31-1970, 45 y.o.   MRN: [id number]  HPI The patient is a 45 YO female coming in for cyst on her right foot. Seeing podiatry and they recommend surgery as well as no weight bearing until surgery. She has continued to walk on it with severe pain. She wants to get the surgery but they need her to get checked for blood clots. She has several hard spots which she is concerned about. Some swelling in the foot and ankle from the cyst. No swelling in her calf. She is not moving much. Since she is not able to move around she needs a handicapped sticker for her mom who drives her around. She also thinks that she needs an aide until she is able to get the surgery to help with some household tasks.   Review of Systems  Constitutional: Negative for fever, activity change, appetite change, fatigue and unexpected weight change.  HENT: Negative.   Respiratory: Negative.   Cardiovascular: Positive for leg swelling. Negative for chest pain and palpitations.  Gastrointestinal: Negative.   Musculoskeletal: Positive for myalgias, arthralgias and gait problem.  Skin: Negative for pallor and wound.  Neurological: Negative.       Objective:   Physical Exam  Constitutional: She appears well-developed and well-nourished.  Morbidly obese  HENT:  Head: Normocephalic and atraumatic.  Cardiovascular: Normal rate and regular rhythm.   Pulmonary/Chest: Effort normal.  Abdominal: Soft.  Musculoskeletal: She exhibits edema.  Some mild swelling in the ankles and no calf swelling or rash on the skin.   Skin: Skin is warm and dry.   Filed Vitals:   04/12/15 1115  BP: 122/78  Pulse: 118  Temp: 98.3 F (36.8 C)  TempSrc: Oral  Resp: 18  Height: 5\' 5"  (1.651 m)  Weight: 282 lb (127.914 kg)  SpO2: 98%      Assessment & Plan:

## 2015-04-12 NOTE — Assessment & Plan Note (Signed)
Checking labs for the muscle spasms she is having and pain, adjust as needed. Pain control per podiatry. She is waiting on surgery due to rule out blood clots. Venous US ordered today although no clear evidence on exam for blood clot. The hard spots she is feeling are likely superficial.

## 2015-04-12 NOTE — Progress Notes (Signed)
Pre visit review using our clinic review tool, if applicable. No additional management support is needed unless otherwise documented below in the visit note. 

## 2015-04-12 NOTE — Patient Instructions (Signed)
We will get the ultrasound of the right leg to make sure there are no clots.   We will check the labs today and call you back with the results.   We have also put in for the home health to help out around the house.

## 2015-04-13 ENCOUNTER — Telehealth: Payer: Self-pay | Admitting: Internal Medicine

## 2015-04-13 NOTE — Telephone Encounter (Signed)
Pt called in and would like to be referred to [facility] [phone] for home health.  This is who she would like to use for Home Health

## 2015-04-14 NOTE — Telephone Encounter (Signed)
[place],   I looked over this client's chart and I called and talked with her. She was wanting a Home health Aid thru Medicaid. I explained the process to her and she asked that I let you all know to please fill out the paperwork for personal care services thru medicaid. As I was typing this letter, the patient called me back and told me to just have the MD forget the referral at this time. She states that she thinks she will be ok and she didn't realize that it would take some time to get this service and they had to evaluate her from the state. I had explained the criteria for her. (I am thinking maybe she didn't meet based on my discussion.) I did tell her if she changed her mind, to please let the MD office know.   I hope that helps.   THANKS

## 2015-04-20 ENCOUNTER — Telehealth: Payer: Self-pay | Admitting: Internal Medicine

## 2015-04-20 NOTE — Telephone Encounter (Signed)
Pt states the referral for home health care was done wrong and a new one needs to be put in.  Please give her a call at [phone]

## 2015-04-21 NOTE — Telephone Encounter (Signed)
Since the patient declined, the agency deleted the orders. They need new ones.

## 2015-04-21 NOTE — Telephone Encounter (Signed)
Patient refused home health at first and now she wants it. She said they need a new referral to be sent.

## 2015-04-21 NOTE — Telephone Encounter (Signed)
I have already placed the order which should still be active.

## 2015-04-21 NOTE — Telephone Encounter (Signed)
Can you forward our active order for home health.

## 2015-04-26 NOTE — Telephone Encounter (Signed)
Sent msg to [place] @ [facility] to see if she can use existing order.

## 2015-04-29 NOTE — Telephone Encounter (Signed)
Medicaid PCS form faxed to [facility].

## 2015-05-03 ENCOUNTER — Ambulatory Visit (HOSPITAL_COMMUNITY)
Admission: RE | Admit: 2015-05-03 | Discharge: 2015-05-03 | Disposition: A | Discharge status: Home / Self Care | Payer: Medicare Other | Source: Ambulatory Visit | Attending: Internal Medicine | Admitting: Internal Medicine

## 2015-05-03 DIAGNOSIS — I10 Essential (primary) hypertension: Secondary | ICD-10-CM | POA: Diagnosis not present

## 2015-05-03 DIAGNOSIS — M25561 Pain in right knee: Secondary | ICD-10-CM | POA: Diagnosis not present

## 2015-05-04 ENCOUNTER — Other Ambulatory Visit: Payer: Self-pay | Admitting: Internal Medicine

## 2015-05-04 ENCOUNTER — Telehealth: Payer: Self-pay | Admitting: Internal Medicine

## 2015-05-04 NOTE — Telephone Encounter (Signed)
Pt states she has been getting phone calls from [place] and I don't see any notes regarding this. I did let her know her lab results from a couple weeks ago. I'm not sure if it was you that called her. If you need to please call the home phone

## 2015-05-31 ENCOUNTER — Other Ambulatory Visit: Payer: Self-pay | Admitting: Internal Medicine

## 2015-06-09 ENCOUNTER — Other Ambulatory Visit: Payer: Self-pay | Admitting: Internal Medicine

## 2015-06-09 DIAGNOSIS — N63 Unspecified lump in unspecified breast: Secondary | ICD-10-CM

## 2015-06-21 ENCOUNTER — Ambulatory Visit
Admission: RE | Admit: 2015-06-21 | Discharge: 2015-06-21 | Disposition: A | Discharge status: Home / Self Care | Payer: Medicare Other | Source: Ambulatory Visit | Attending: Internal Medicine | Admitting: Internal Medicine

## 2015-06-21 DIAGNOSIS — N63 Unspecified lump in unspecified breast: Secondary | ICD-10-CM

## 2015-06-21 DIAGNOSIS — R928 Other abnormal and inconclusive findings on diagnostic imaging of breast: Secondary | ICD-10-CM | POA: Diagnosis not present

## 2015-07-11 ENCOUNTER — Other Ambulatory Visit: Payer: Self-pay | Admitting: Internal Medicine

## 2015-07-27 ENCOUNTER — Telehealth: Payer: Self-pay | Admitting: Internal Medicine

## 2015-07-27 NOTE — Telephone Encounter (Signed)
Patient is needing a new order sent into home health for 80 hours a month.  Right now patient is at 54 hours a month.  States she needs more help a month.  [facility] goes through [facility].

## 2015-07-28 NOTE — Telephone Encounter (Signed)
Tried to call patient.  Phone call would not go through

## 2015-07-28 NOTE — Telephone Encounter (Signed)
She would have to be seen to evaluate for change.

## 2015-07-29 NOTE — Telephone Encounter (Signed)
Got scheduled  °

## 2015-08-16 ENCOUNTER — Ambulatory Visit (INDEPENDENT_AMBULATORY_CARE_PROVIDER_SITE_OTHER): Payer: Medicare Other | Admitting: Internal Medicine

## 2015-08-16 ENCOUNTER — Other Ambulatory Visit (INDEPENDENT_AMBULATORY_CARE_PROVIDER_SITE_OTHER): Payer: Medicare Other

## 2015-08-16 ENCOUNTER — Encounter: Payer: Self-pay | Admitting: Internal Medicine

## 2015-08-16 VITALS — BP 102/80 | HR 99 | Temp 98.5°F | Resp 18 | Ht 65.0 in | Wt 292.0 lb

## 2015-08-16 DIAGNOSIS — Z6841 Body Mass Index (BMI) 40.0 and over, adult: Secondary | ICD-10-CM

## 2015-08-16 DIAGNOSIS — E039 Hypothyroidism, unspecified: Secondary | ICD-10-CM

## 2015-08-16 DIAGNOSIS — R7301 Impaired fasting glucose: Secondary | ICD-10-CM

## 2015-08-16 DIAGNOSIS — E0500 Thyrotoxicosis with diffuse goiter without thyrotoxic crisis or storm: Secondary | ICD-10-CM

## 2015-08-16 DIAGNOSIS — I10 Essential (primary) hypertension: Secondary | ICD-10-CM

## 2015-08-16 LAB — LIPID PANEL
Cholesterol: 200 mg/dL (ref 0–200)
HDL: 41.7 mg/dL (ref >39.00)
LDL Cholesterol: 123 mg/dL — ABNORMAL HIGH (ref 0–99)
NONHDL: 158.36
Total CHOL/HDL Ratio: 5
Triglycerides: 178 mg/dL — ABNORMAL HIGH (ref 0.0–149.0)
VLDL: 35.6 mg/dL (ref 0.0–40.0)

## 2015-08-16 LAB — T4, FREE: Free T4: 0.72 ng/dL (ref 0.60–1.60)

## 2015-08-16 LAB — TSH: TSH: 5.05 u[IU]/mL — ABNORMAL HIGH (ref 0.35–4.50)

## 2015-08-16 LAB — HEMOGLOBIN A1C: HEMOGLOBIN A1C: 5.3 % (ref 4.6–6.5)

## 2015-08-16 MED ORDER — ALBUTEROL SULFATE HFA 108 (90 BASE) MCG/ACT IN AERS: 2.0000 | INHALATION_SPRAY | Freq: Four times a day (QID) | RESPIRATORY_TRACT | Status: AC | PRN

## 2015-08-16 NOTE — Progress Notes (Signed)
Pre visit review using our clinic review tool, if applicable. No additional management support is needed unless otherwise documented below in the visit note. 

## 2015-08-16 NOTE — Progress Notes (Signed)
   Subjective:    Patient ID: Tonya Greene, female    DOB: 25-Feb-1970, 45 y.o.   MRN: [id number]  HPI The patient is a 45 YO female coming in with request for more time with home health PCS. She does have medicaid and PCS currently helping 54 hours per month. She does want more time and was initially offered 80 hours per month. She declined that need but now wants more time. They are helping with chores and IADLs. She is able to do her ADLs by herself. Using [facility] currently. Does not have any papers for [place] today or their contact information.   Review of Systems  Constitutional: Negative for fever, activity change, appetite change, fatigue and unexpected weight change.  HENT: Negative.   Respiratory: Negative.   Cardiovascular: Positive for leg swelling. Negative for chest pain and palpitations.  Gastrointestinal: Negative.   Musculoskeletal: Positive for myalgias, arthralgias and gait problem.  Skin: Negative for pallor and wound.  Neurological: Negative.       Objective:   Physical Exam  Constitutional: She appears well-developed and well-nourished.  Morbidly obese  HENT:  Head: Normocephalic and atraumatic.  Cardiovascular: Normal rate and regular rhythm.   Pulmonary/Chest: Effort normal.  Abdominal: Soft.  Musculoskeletal: She exhibits edema.  Some mild swelling in the ankles and no calf swelling or rash on the skin.   Skin: Skin is warm and dry.   Filed Vitals:   08/16/15 1357  BP: 102/80  Pulse: 99  Temp: 98.5 F (36.9 C)  TempSrc: Oral  Resp: 18  Height: 5\' 5"  (1.651 m)  Weight: 292 lb (132.45 kg)  SpO2: 95%      Assessment & Plan:

## 2015-08-16 NOTE — Assessment & Plan Note (Signed)
Checking TSH and free T4. Adjust synthroid 175 mcg as needed.

## 2015-08-16 NOTE — Patient Instructions (Addendum)
Call us back with the information about the home health agency or we cannot make any changes. We will have to call them and ask about the changes.

## 2015-08-16 NOTE — Assessment & Plan Note (Addendum)
BP at goal on her nifedipine and lisinopril.

## 2015-08-16 NOTE — Assessment & Plan Note (Signed)
Weight is up about 10 pounds since last visit which is the biggest impairment to her ADLs and QOL.

## 2015-09-12 ENCOUNTER — Other Ambulatory Visit: Payer: Self-pay | Admitting: Internal Medicine

## 2015-10-25 ENCOUNTER — Other Ambulatory Visit: Payer: Self-pay | Admitting: Internal Medicine

## 2015-11-21 ENCOUNTER — Other Ambulatory Visit: Payer: Self-pay | Admitting: Internal Medicine

## 2015-11-21 DIAGNOSIS — N6310 Unspecified lump in the right breast, unspecified quadrant: Secondary | ICD-10-CM

## 2016-01-03 ENCOUNTER — Ambulatory Visit
Admission: RE | Admit: 2016-01-03 | Discharge: 2016-01-03 | Disposition: A | Discharge status: Home / Self Care | Payer: Medicare Other | Source: Ambulatory Visit | Attending: Internal Medicine | Admitting: Internal Medicine

## 2016-01-03 DIAGNOSIS — N6311 Unspecified lump in the right breast, upper outer quadrant: Secondary | ICD-10-CM | POA: Diagnosis not present

## 2016-01-03 DIAGNOSIS — N6310 Unspecified lump in the right breast, unspecified quadrant: Secondary | ICD-10-CM

## 2016-01-03 DIAGNOSIS — R928 Other abnormal and inconclusive findings on diagnostic imaging of breast: Secondary | ICD-10-CM | POA: Diagnosis not present

## 2016-02-15 ENCOUNTER — Other Ambulatory Visit: Payer: Self-pay | Admitting: Internal Medicine

## 2016-06-17 IMAGING — MG MM SCREENING BREAST TOMO BILATERAL
8 of 9 series · 8 of 9 positions shown · non-contrast
Comparison: none

CLINICAL DATA: Screening.

EXAM:
DIGITAL SCREENING BILATERAL MAMMOGRAM WITH CAD

[R CC (1 of 3)]
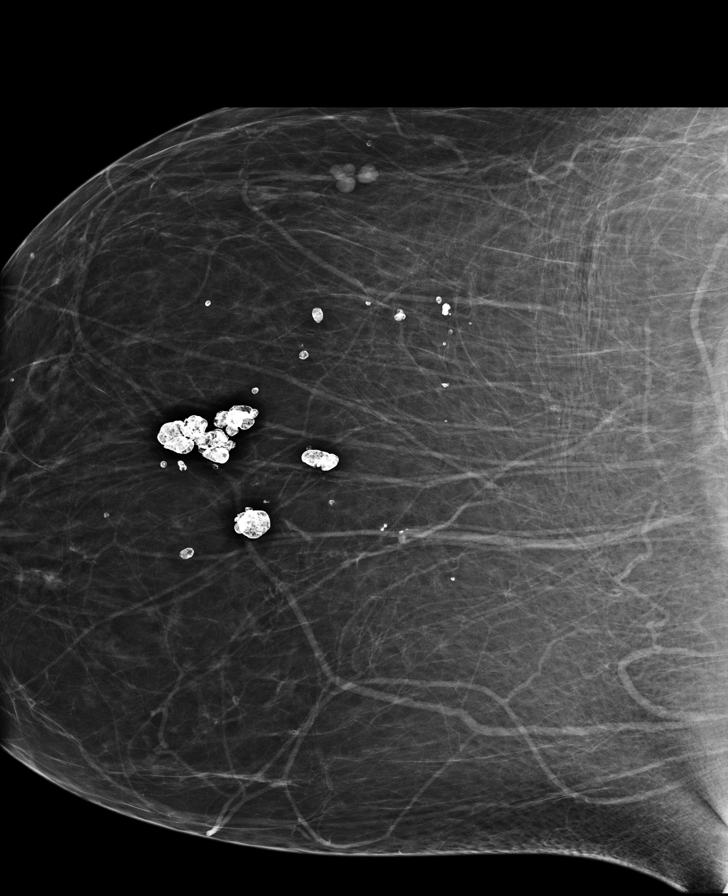

[L CC (1 of 2)]
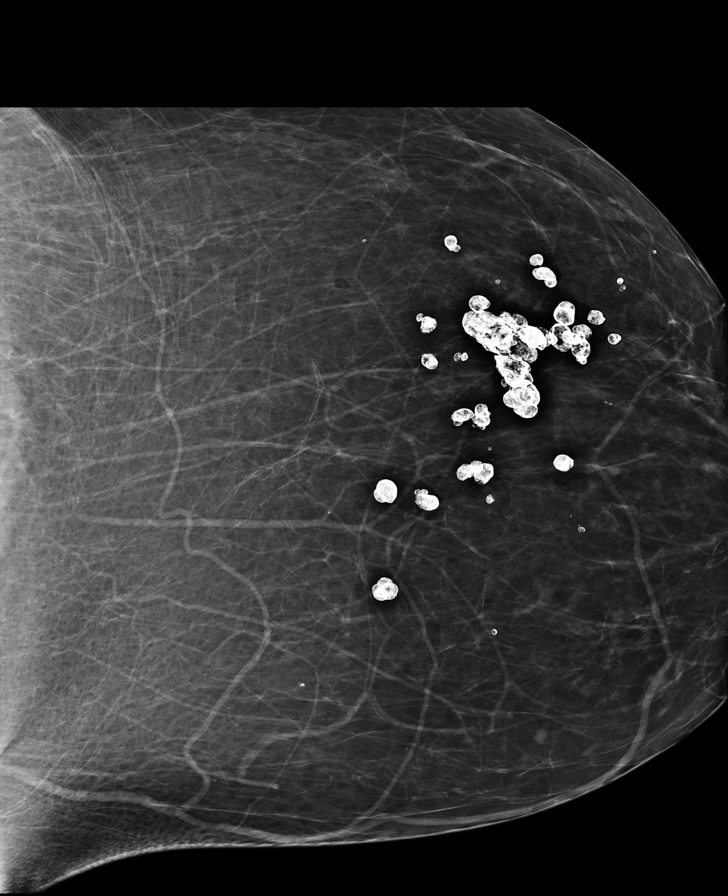

[L CC (2 of 2)]
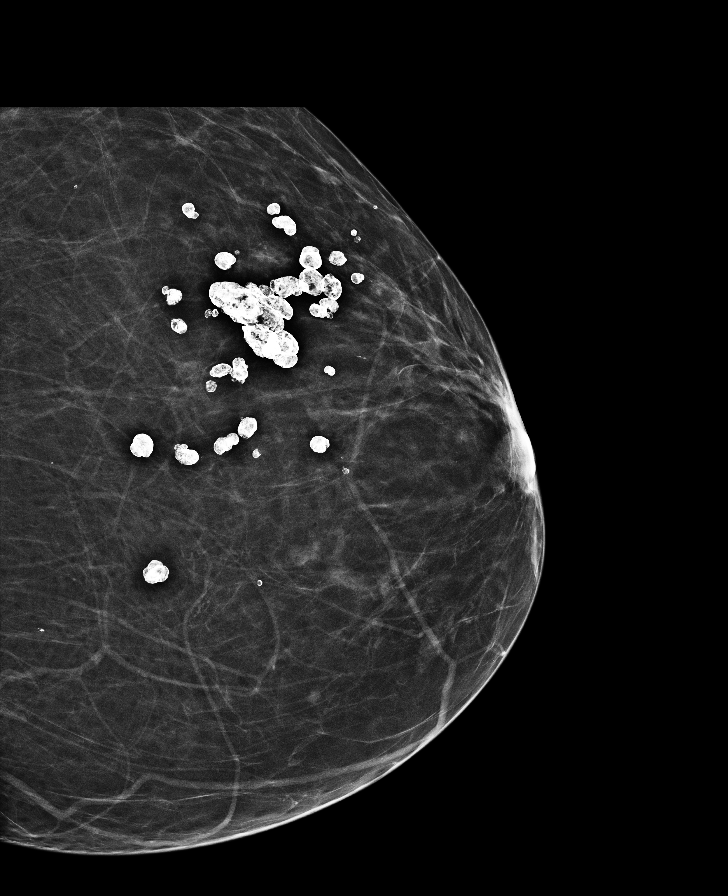

[R MLO (1 of 2)]
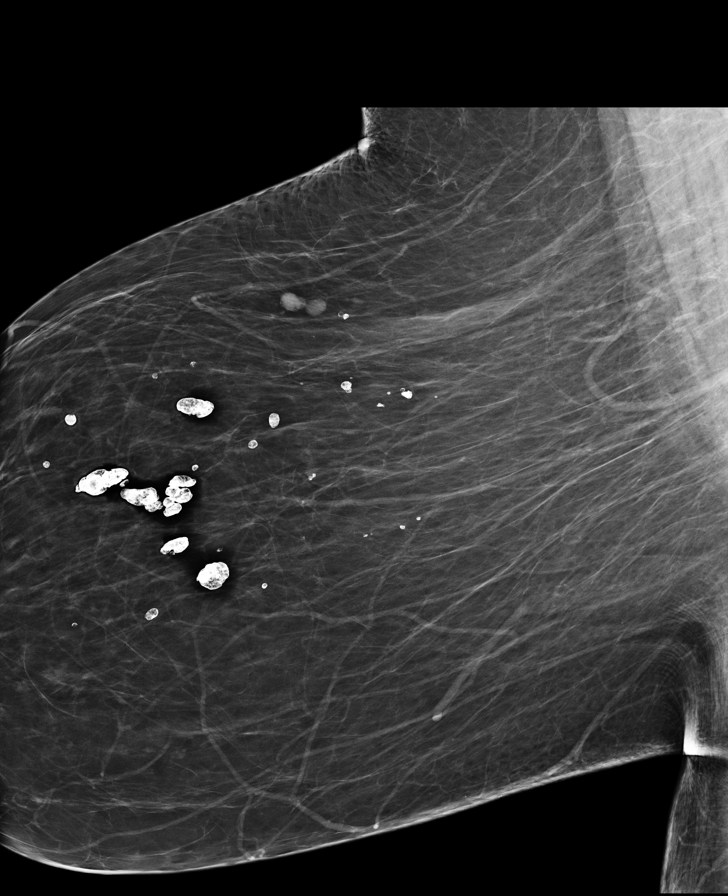

[R CC (2 of 3)]
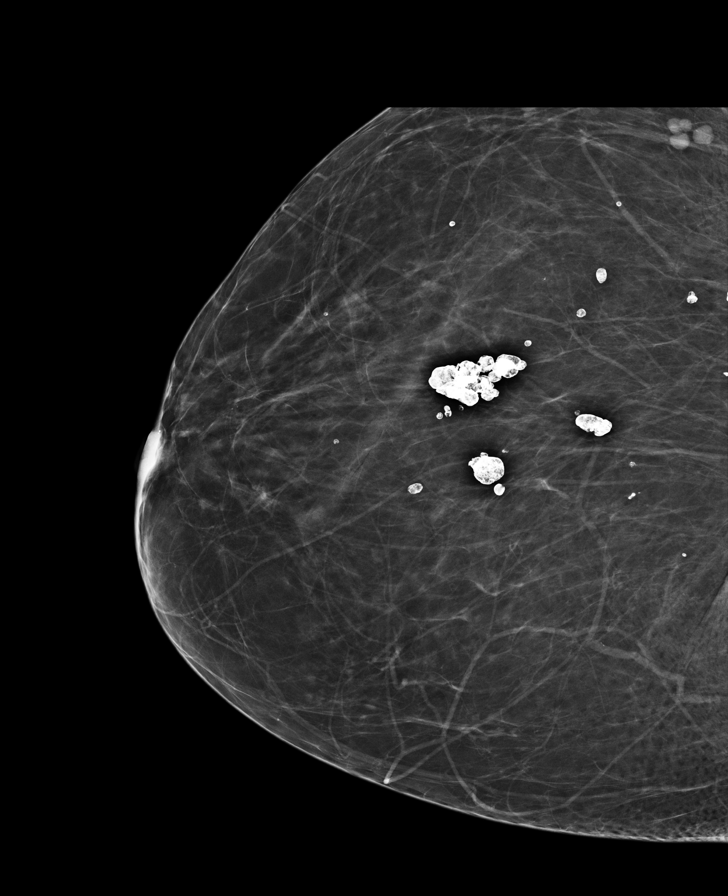

[L MLO]
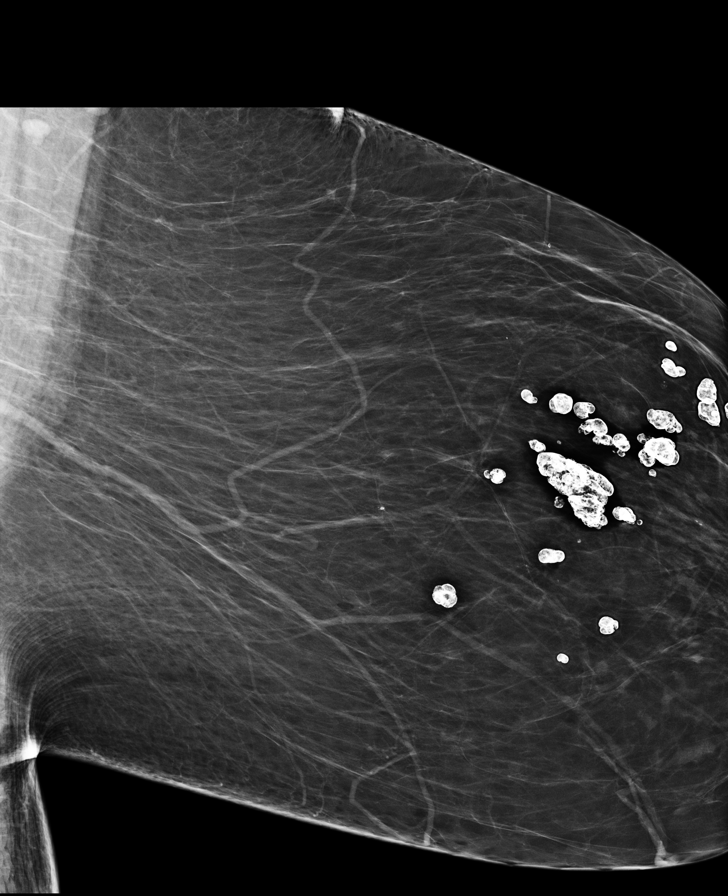

[R CC (3 of 3)]
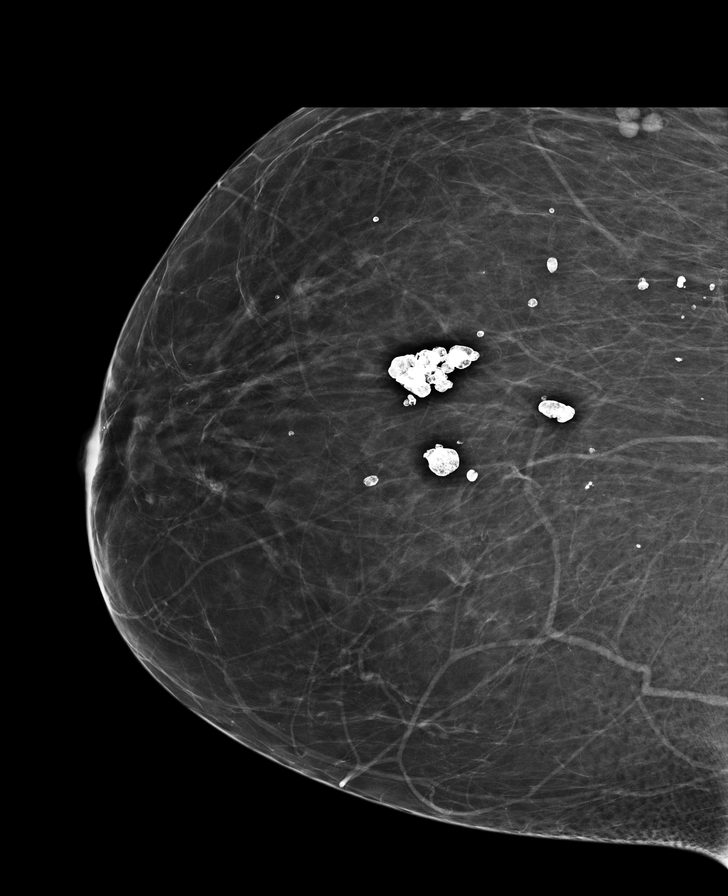

[R MLO (2 of 2)]
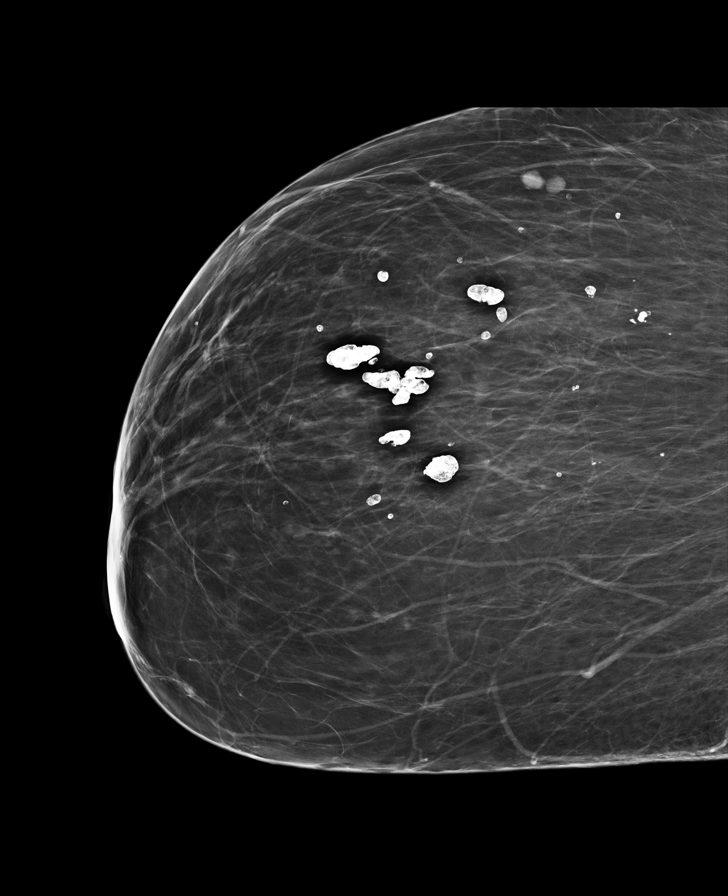

[8 of 9 positions shown; findings below may reference images not displayed]

ACR Breast Density Category b: There are scattered areas of
fibroglandular density.
FINDINGS: In the right breast, a possible mass warrants further evaluation. In
the left breast, no findings suspicious for malignancy. Images were
processed with CAD.
IMPRESSION: Further evaluation is suggested for possible mass in the right
breast.

RECOMMENDATION:
Diagnostic mammogram and possibly ultrasound of the right breast.
(Code:13-J-VVX)

The patient will be contacted regarding the findings, and additional
imaging will be scheduled.

BI-RADS CATEGORY  0: Incomplete. Need additional imaging evaluation
and/or prior mammograms for comparison.

## 2016-07-24 ENCOUNTER — Other Ambulatory Visit (HOSPITAL_COMMUNITY)
Admission: RE | Admit: 2016-07-24 | Discharge: 2016-07-24 | Disposition: A | Discharge status: Home / Self Care | Payer: Medicare Other | Source: Ambulatory Visit | Attending: Nurse Practitioner | Admitting: Nurse Practitioner

## 2016-07-24 ENCOUNTER — Other Ambulatory Visit: Payer: Self-pay | Admitting: Nurse Practitioner

## 2016-07-24 ENCOUNTER — Other Ambulatory Visit (INDEPENDENT_AMBULATORY_CARE_PROVIDER_SITE_OTHER): Payer: Medicare Other

## 2016-07-24 ENCOUNTER — Encounter: Payer: Self-pay | Admitting: Nurse Practitioner

## 2016-07-24 ENCOUNTER — Ambulatory Visit: Payer: Medicare Other | Admitting: Internal Medicine

## 2016-07-24 ENCOUNTER — Other Ambulatory Visit: Payer: Medicare Other

## 2016-07-24 ENCOUNTER — Ambulatory Visit (INDEPENDENT_AMBULATORY_CARE_PROVIDER_SITE_OTHER): Payer: Medicare Other | Admitting: Nurse Practitioner

## 2016-07-24 VITALS — BP 116/78 | HR 88 | Temp 98.5°F | Ht 65.0 in | Wt 297.0 lb

## 2016-07-24 DIAGNOSIS — Z23 Encounter for immunization: Secondary | ICD-10-CM

## 2016-07-24 DIAGNOSIS — Z114 Encounter for screening for human immunodeficiency virus [HIV]: Secondary | ICD-10-CM

## 2016-07-24 DIAGNOSIS — Z124 Encounter for screening for malignant neoplasm of cervix: Secondary | ICD-10-CM

## 2016-07-24 DIAGNOSIS — E782 Mixed hyperlipidemia: Secondary | ICD-10-CM

## 2016-07-24 DIAGNOSIS — K219 Gastro-esophageal reflux disease without esophagitis: Secondary | ICD-10-CM | POA: Insufficient documentation

## 2016-07-24 DIAGNOSIS — Z0000 Encounter for general adult medical examination without abnormal findings: Secondary | ICD-10-CM | POA: Diagnosis not present

## 2016-07-24 DIAGNOSIS — E0500 Thyrotoxicosis with diffuse goiter without thyrotoxic crisis or storm: Secondary | ICD-10-CM

## 2016-07-24 DIAGNOSIS — M519 Unspecified thoracic, thoracolumbar and lumbosacral intervertebral disc disorder: Secondary | ICD-10-CM

## 2016-07-24 DIAGNOSIS — I10 Essential (primary) hypertension: Secondary | ICD-10-CM

## 2016-07-24 LAB — COMPREHENSIVE METABOLIC PANEL
ALK PHOS: 64 U/L (ref 39–117)
ALT: 24 U/L (ref 0–35)
AST: 24 U/L (ref 0–37)
Albumin: 4.5 g/dL (ref 3.5–5.2)
BUN: 10 mg/dL (ref 6–23)
CHLORIDE: 106 meq/L (ref 96–112)
CO2: 27 mEq/L (ref 19–32)
CREATININE: 0.69 mg/dL (ref 0.40–1.20)
Calcium: 9.6 mg/dL (ref 8.4–10.5)
GFR: 116.72 mL/min (ref >60.00)
GLUCOSE: 89 mg/dL (ref 70–99)
POTASSIUM: 4.6 meq/L (ref 3.5–5.1)
SODIUM: 140 meq/L (ref 135–145)
TOTAL PROTEIN: 6.9 g/dL (ref 6.0–8.3)
Total Bilirubin: 0.3 mg/dL (ref 0.2–1.2)

## 2016-07-24 LAB — T3, FREE: T3, Free: 3.4 pg/mL (ref 2.3–4.2)

## 2016-07-24 LAB — TSH: TSH: 11.77 u[IU]/mL — AB (ref 0.35–4.50)

## 2016-07-24 LAB — LIPID PANEL
Cholesterol: 176 mg/dL (ref 0–200)
HDL: 44 mg/dL (ref >39.00)
LDL CALC: 110 mg/dL — AB (ref 0–99)
NONHDL: 131.6
Total CHOL/HDL Ratio: 4
Triglycerides: 106 mg/dL (ref 0.0–149.0)
VLDL: 21.2 mg/dL (ref 0.0–40.0)

## 2016-07-24 LAB — T4, FREE: Free T4: 0.74 ng/dL (ref 0.60–1.60)

## 2016-07-24 MED ORDER — LEVOTHYROXINE SODIUM 200 MCG PO TABS: 200.0000 ug | ORAL_TABLET | Freq: Every day | ORAL | 2 refills | Status: DC

## 2016-07-24 MED ORDER — RANITIDINE HCL 150 MG PO TABS: 150.0000 mg | ORAL_TABLET | Freq: Two times a day (BID) | ORAL | 5 refills | Status: DC

## 2016-07-24 MED ORDER — LISINOPRIL 10 MG PO TABS: 10.0000 mg | ORAL_TABLET | Freq: Every day | ORAL | 1 refills | Status: DC

## 2016-07-24 MED ORDER — NAPROXEN 500 MG PO TABS: 500.0000 mg | ORAL_TABLET | Freq: Two times a day (BID) | ORAL | 3 refills | Status: DC | PRN

## 2016-07-24 MED ORDER — NIFEDIPINE ER 60 MG PO TB24: 60.0000 mg | ORAL_TABLET | Freq: Every day | ORAL | 1 refills | Status: DC

## 2016-07-24 MED ORDER — CYCLOBENZAPRINE HCL 5 MG PO TABS: 5.0000 mg | ORAL_TABLET | Freq: Three times a day (TID) | ORAL | 3 refills | Status: DC | PRN

## 2016-07-24 NOTE — Patient Instructions (Signed)
Will send thyroid medication after review of labs.  Go to basement for blood draw.

## 2016-07-24 NOTE — Progress Notes (Addendum)
Subjective:    Patient ID: Tonya Greene, female    DOB: [birth year], 46 y.o.   MRN: [id number]  Patient presents today for complete physical and medication refill  HPI Denies any acute complains.  PCP: Dr. Sharlet Salina.  Immunizations: (TDAP, Hep C screen, Pneumovax, Influenza, zoster)  Health Maintenance  Topic Date Due  . Flu Shot  09/05/2016  . Pap Smear  07/25/2019  . Tetanus Vaccine  07/25/2026  . HIV Screening  Completed   Diet:regular  Weight:  Wt Readings from Last 3 Encounters:  07/24/16 297 lb (134.7 kg)  08/16/15 292 lb (132.5 kg)  04/12/15 282 lb (127.9 kg)   Exercise:none  Fall Risk: Fall Risk  07/24/2016  Falls in the past year? No   Home Safety:home with mother.  Depression/Suicide: Depression screen [facility] 2/9 07/24/2016  Decreased Interest 0  Down, Depressed, Hopeless 0  PHQ - 2 Score 0   Pap Smear (every 69yrs for >21-29 without HPV, every 59yrs for >30-74yrs with HPV):needed.  Mammogram (yearly, >97yrs):last done 1017, evaluation of bilateral breast lump. Get mammogram annually  Vision:needed.  Dental:needed, no dental insurance  Advanced Directive: Advanced Directives 07/18/2014  Does Patient Have a Medical Advance Directive? No   Sexual History (birth control, marital status, STD):single, sexually active, no contraceptive use.  Medications and allergies reviewed with patient and updated if appropriate.  Patient Active Problem List   Diagnosis Date Noted  . GERD (gastroesophageal reflux disease) 07/24/2016  . Ganglion cyst of right foot 04/12/2015  . Graves disease 10/29/2013  . Benign essential HTN 10/29/2013  . Lumbar disc disease 10/29/2013  . Morbid obesity with BMI of 45.0-49.9, adult ([place]) 10/29/2013    Current Outpatient Prescriptions on File Prior to Visit  Medication Sig Dispense Refill  . albuterol (PROVENTIL HFA;VENTOLIN HFA) 108 (90 Base) MCG/ACT inhaler Inhale 2 puffs into the lungs every 6 (six) hours as needed for  wheezing or shortness of breath. 1 Inhaler 2  . cetirizine (ZYRTEC) 10 MG tablet TAKE 1 TABLET BY MOUTH DAILY 90 tablet 1   No current facility-administered medications on file prior to visit.     Past Medical History:  Diagnosis Date  . Arthritis   . Depression   . Disc disease with myelopathy, lumbar   . Hypertension   . Muscle spasms of both lower extremities   . Thyroid cancer [facility])     Past Surgical History:  Procedure Laterality Date  . COLONOSCOPY     repair of fissures  . glands     under arms-removed    Social History   Social History  . Marital status: Single    Spouse name: N/A  . Number of children: N/A  . Years of education: N/A   Social History Main Topics  . Smoking status: Current Every Day Smoker    Types: Cigarettes  . Smokeless tobacco: Never Used  . Alcohol use Yes  . Drug use: Unknown  . Sexual activity: Yes    Birth control/ protection: Condom   Other Topics Concern  . None   Social History Narrative  . None    Family History  Problem Relation Age of Onset  . Diabetes Mother   . Hypertension Mother   . Diabetes Father   . Hypertension Father   . Heart failure Father         Review of Systems  Constitutional: Negative for malaise/fatigue and weight loss.  HENT: Negative for congestion, hearing loss and sore throat.   Eyes:  Negative for blurred vision.  Respiratory: Negative for cough, sputum production and shortness of breath.   Cardiovascular: Positive for leg swelling. Negative for chest pain, palpitations and PND.  Gastrointestinal: Negative for abdominal pain, blood in stool, constipation, diarrhea, heartburn and melena.  Musculoskeletal: Positive for back pain and joint pain.       Chronic  Skin: Positive for itching.       In skin folds (chronic, intermittent)  Neurological: Negative for sensory change and weakness.    Objective:   Vitals:   07/24/16 1439  BP: 116/78  Pulse: 88  Temp: 98.5 F (36.9 C)     Body mass index is 49.42 kg/m.   Physical Examination:  Physical Exam  Constitutional: She is oriented to person, place, and time and well-developed, well-nourished, and in no distress. No distress.  HENT:  Right Ear: External ear normal.  Left Ear: External ear normal.  Nose: Nose normal.  Mouth/Throat: No oropharyngeal exudate.  Eyes: Conjunctivae and EOM are normal. Pupils are equal, round, and reactive to light. No scleral icterus.  Neck: Normal range of motion. Neck supple. No thyromegaly present.  Cardiovascular: Normal rate, regular rhythm, normal heart sounds and intact distal pulses.   Pulmonary/Chest: Effort normal and breath sounds normal. Right breast exhibits no inverted nipple, no mass, no nipple discharge and no skin change. Left breast exhibits no inverted nipple, no mass, no nipple discharge and no skin change. Breasts are symmetrical.  Abdominal: Soft. Bowel sounds are normal. She exhibits no distension. There is no tenderness.  Genitourinary: Rectum normal, vagina normal, cervix normal, right adnexa normal, left adnexa normal and vulva normal. Cervix exhibits no motion tenderness. No vaginal discharge found.  Musculoskeletal: Normal range of motion. She exhibits no edema or tenderness.  Lymphadenopathy:    She has no cervical adenopathy.  Neurological: She is alert and oriented to person, place, and time. Gait normal.  Skin: Skin is warm and dry.  Psychiatric: Affect and judgment normal.  Vitals reviewed.   ASSESSMENT and PLAN:  Mckaila was seen today for follow-up.  Diagnoses and all orders for this visit:  Benign essential HTN -     Comprehensive metabolic panel; Future -     lisinopril (PRINIVIL,ZESTRIL) 10 MG tablet; Take 1 tablet (10 mg total) by mouth daily. -     NIFEdipine (PROCARDIA-XL/ADALAT CC) 60 MG 24 hr tablet; Take 1 tablet (60 mg total) by mouth daily.  Graves disease -     TSH; Future -     T4, free; Future -     T3, free; Future -      levothyroxine (SYNTHROID, LEVOTHROID) 200 MCG tablet; Take 1 tablet (200 mcg total) by mouth daily before breakfast. -     Ambulatory referral to Endocrinology  Cervical cancer screening -     Cytology - PAP; Future  Encounter for screening for human immunodeficiency virus (HIV) -     HIV antibody; Future  Mixed hyperlipidemia -     Comprehensive metabolic panel; Future -     Lipid panel; Future  Gastroesophageal reflux disease without esophagitis -     Discontinue: ranitidine (ZANTAC) 150 MG tablet; Take 1 tablet (150 mg total) by mouth 2 (two) times daily.  Lumbar disc disease -     cyclobenzaprine (FLEXERIL) 5 MG tablet; Take 1 tablet (5 mg total) by mouth 3 (three) times daily as needed for muscle spasms. -     naproxen (NAPROSYN) 500 MG tablet; Take 1 tablet (500 mg total)  by mouth 2 (two) times daily between meals as needed.  Need for diphtheria-tetanus-pertussis (Tdap) vaccine -     Tdap vaccine greater than or equal to 7yo IM    No problem-specific Assessment & Plan notes found for this encounter.      Follow up: Return in about 6 months (around 01/23/2017) for HTN and hypothyroidism with [facility].  Wilfred Lacy, NP

## 2016-07-25 LAB — HIV ANTIBODY (ROUTINE TESTING W REFLEX): HIV: NONREACTIVE

## 2016-07-26 ENCOUNTER — Telehealth: Payer: Self-pay | Admitting: Internal Medicine

## 2016-07-26 LAB — CYTOLOGY - PAP
DIAGNOSIS: NEGATIVE
HPV: NOT DETECTED

## 2016-07-26 NOTE — Telephone Encounter (Signed)
Pt missed cal RE sched new pt appt.  Thank you,  -LL

## 2016-07-27 ENCOUNTER — Encounter: Payer: Self-pay | Admitting: Nurse Practitioner

## 2016-07-31 NOTE — Addendum Note (Signed)
Addended by: Wilfred Lacy L on: 07/31/2016 01:07 PM   Modules accepted: Level of Service

## 2016-09-04 ENCOUNTER — Ambulatory Visit: Payer: Medicare Other | Admitting: Endocrinology

## 2016-09-04 DIAGNOSIS — Z0289 Encounter for other administrative examinations: Secondary | ICD-10-CM

## 2016-09-26 ENCOUNTER — Ambulatory Visit (INDEPENDENT_AMBULATORY_CARE_PROVIDER_SITE_OTHER): Payer: Medicare Other | Admitting: Endocrinology

## 2016-09-26 ENCOUNTER — Encounter: Payer: Self-pay | Admitting: Endocrinology

## 2016-09-26 VITALS — BP 108/70 | HR 98 | Ht 65.0 in | Wt 290.6 lb

## 2016-09-26 DIAGNOSIS — F329 Major depressive disorder, single episode, unspecified: Secondary | ICD-10-CM

## 2016-09-26 DIAGNOSIS — N951 Menopausal and female climacteric states: Secondary | ICD-10-CM

## 2016-09-26 DIAGNOSIS — F32A Depression, unspecified: Secondary | ICD-10-CM

## 2016-09-26 DIAGNOSIS — E890 Postprocedural hypothyroidism: Secondary | ICD-10-CM

## 2016-09-26 NOTE — Progress Notes (Signed)
Patient ID: Tonya Greene, female   DOB: March 17, 1970, 46 y.o.   MRN: [id number]            Referring Physician:Crawford  Reason for Appointment:  Hypothyroidism, new visit    History of Present Illness:   Hypothyroidism was first diagnosed in 1996 Prior to this she had Graves' disease treated with I-131 for her hyperthyroidism but details of this are not available She thinks she was started on levothyroxine after thyroid ablation Again no prior records are available except since 2015 when she moved back here  She has been followed periodically by her PCP and has been mostly taking 175 g of levothyroxine Review of her labs show low normal TSH in 2015 and 2016 but mild increase in 2017  She does not think she has any new symptoms of unusual fatigue, weight gain or cold intolerance She has had periodic fatigue related to her difficulty with sleep and depression  She does take her thyroid supplement very consistently at least an hour before breakfast but is also taking are other medication with this Does not take any vitamins at the same time         Patient's weight history is as follows:  Wt Readings from Last 3 Encounters:  09/26/16 290 lb 9.6 oz (131.8 kg)  07/24/16 297 lb (134.7 kg)  08/16/15 292 lb (132.5 kg)    Thyroid function results have been as follows:  Lab Results  Component Value Date   TSH 11.77 (H) 07/24/2016   TSH 5.05 (H) 08/16/2015   TSH 0.53 04/27/2014   TSH 0.35 10/27/2013   FREET4 0.74 07/24/2016   FREET4 0.72 08/16/2015   FREET4 1.21 10/27/2013   T3FREE 3.4 07/24/2016     Past Medical History:  Diagnosis Date  . Arthritis   . Depression   . Disc disease with myelopathy, lumbar   . Hypertension   . Muscle spasms of both lower extremities   . Thyroid cancer [facility])     Past Surgical History:  Procedure Laterality Date  . COLONOSCOPY     repair of fissures  . glands     under arms-removed    Family History  Problem Relation Age of  Onset  . Diabetes Mother   . Hypertension Mother   . Diabetes Father   . Hypertension Father   . Heart failure Father     Social History:  reports that she has been smoking Cigarettes.  She has never used smokeless tobacco. She reports that she drinks alcohol. Her drug history is not on file.  Allergies: No Known Allergies  Allergies as of 09/26/2016   No Known Allergies     Medication List       Accurate as of 09/26/16  4:33 PM. Always use your most recent med list.          albuterol 108 (90 Base) MCG/ACT inhaler Commonly known as:  PROVENTIL HFA;VENTOLIN HFA Inhale 2 puffs into the lungs every 6 (six) hours as needed for wheezing or shortness of breath.   amoxicillin 500 MG capsule Commonly known as:  AMOXIL Take 500 mg by mouth 3 (three) times daily.   cetirizine 10 MG tablet Commonly known as:  ZYRTEC TAKE 1 TABLET BY MOUTH DAILY   cyclobenzaprine 5 MG tablet Commonly known as:  FLEXERIL Take 1 tablet (5 mg total) by mouth 3 (three) times daily as needed for muscle spasms.   levothyroxine 200 MCG tablet Commonly known as:  SYNTHROID, LEVOTHROID Take 1  tablet (200 mcg total) by mouth daily before breakfast.   lisinopril 10 MG tablet Commonly known as:  PRINIVIL,ZESTRIL Take 1 tablet (10 mg total) by mouth daily.   naproxen 500 MG tablet Commonly known as:  NAPROSYN Take 1 tablet (500 mg total) by mouth 2 (two) times daily between meals as needed.   NIFEdipine 60 MG 24 hr tablet Commonly known as:  PROCARDIA-XL/ADALAT CC Take 1 tablet (60 mg total) by mouth daily.   oxyCODONE-acetaminophen 10-325 MG tablet Commonly known as:  PERCOCET   ranitidine 150 MG tablet Commonly known as:  ZANTAC TAKE 1 TABLET BY MOUTH TWICE DAILY          Review of Systems  Constitutional: Negative for weight loss.  HENT:       At times will have trouble swallowing if she is having reflux symptoms  Respiratory: Negative for shortness of breath.   Cardiovascular:  Negative for palpitations and leg swelling.  Gastrointestinal: Negative for constipation.       Has had reflux symptoms controlled with Zantac now  Endocrine: Positive for fatigue and heat intolerance.       Tends to get periodic sweating and feeling hot and then gets chilled.  Not having menstrual cycles since May  Musculoskeletal: Positive for back pain.  Skin:       No hair loss or dry skin  Neurological: Positive for numbness.       Has some numbness in her legs at times related to her lumbar disc disease  Psychiatric/Behavioral: Positive for depressed mood and insomnia.                Examination:    BP 108/70   Pulse 98   Ht 5\' 5"  (1.651 m)   Wt 290 lb 9.6 oz (131.8 kg)   LMP 06/26/2016   SpO2 98%   BMI 48.36 kg/m   GENERAL:  Large build with generalized obesity  No pallor, clubbing, lymphadenopathy or edema.    Skin:  no rash.  Mild circumferential neck pigmentation present  EYES:  No prominence of the eyes or swelling of the eyelids  ENT: Oral mucosa and tongue normal.  THYROID:  Not palpable.  HEART:  Normal  S1 and S2; no murmur or click.  CHEST:    Lungs: Vescicular breath sounds heard equally.   No crepitations/ wheeze.  ABDOMEN:  No distention.  Liver and spleen not palpable.  No other mass or tenderness.  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 She is usually compliant with her supplementation Her symptoms are difficult to assess because of her concomitant problems with insomnia and depression Currently appears euthyroid on exam  Her TSH has been trending higher since last year and in June was about 11 Most likely her dose of 200 g now is appropriate but will need to confirm with labs today  Hypertension: Appears well-controlled on current regimen, continue follow-up with  PCP  PLAN:  Check thyroid levels and decide on dosage and follow-up Reminded her not to take any vitamins with iron or calcium at the same time as the thyroid supplement  Need to follow-up with PCP regarding referral to psychiatrist, issues with probable early menopause   Geralynn Capri 09/26/2016, 4:33 PM   Consultation note copy sent to the PCP  Note: This office note was prepared with Dragon voice recognition system technology.  Any transcriptional errors that result from this process are unintentional.

## 2016-09-27 LAB — TSH: TSH: 0.18 u[IU]/mL — AB (ref 0.35–4.50)

## 2016-09-27 LAB — T4, FREE: Free T4: 1.32 ng/dL (ref 0.60–1.60)

## 2016-09-27 NOTE — Progress Notes (Signed)
Please call to let patient know that her thyroid level is now little high, needs to continue 200 dosage but once a week take only a half tablet, needs follow-up in 2 months with labs

## 2016-10-04 ENCOUNTER — Telehealth: Payer: Self-pay | Admitting: Endocrinology

## 2016-10-04 NOTE — Telephone Encounter (Signed)
Patient calling for her lab results, the incorrect home number was in the chart. I have updated the number. Call patient to advise.

## 2016-10-04 NOTE — Telephone Encounter (Signed)
Routing to you °

## 2016-10-09 NOTE — Telephone Encounter (Signed)
I looked to see that Tonya Greene has already spoken to this patient and left a message in the result notes area from labs.

## 2016-10-24 ENCOUNTER — Other Ambulatory Visit: Payer: Self-pay | Admitting: Nurse Practitioner

## 2016-10-24 DIAGNOSIS — E0500 Thyrotoxicosis with diffuse goiter without thyrotoxic crisis or storm: Secondary | ICD-10-CM

## 2016-11-27 ENCOUNTER — Other Ambulatory Visit (INDEPENDENT_AMBULATORY_CARE_PROVIDER_SITE_OTHER): Payer: Medicare Other

## 2016-11-27 DIAGNOSIS — E890 Postprocedural hypothyroidism: Secondary | ICD-10-CM

## 2016-11-27 LAB — T4, FREE: FREE T4: 1.13 ng/dL (ref 0.60–1.60)

## 2016-11-27 LAB — TSH: TSH: 0.05 u[IU]/mL — ABNORMAL LOW (ref 0.35–4.50)

## 2016-12-04 ENCOUNTER — Telehealth: Payer: Self-pay | Admitting: Endocrinology

## 2016-12-04 ENCOUNTER — Encounter: Payer: Self-pay | Admitting: Endocrinology

## 2016-12-04 ENCOUNTER — Ambulatory Visit (INDEPENDENT_AMBULATORY_CARE_PROVIDER_SITE_OTHER): Payer: Medicare Other | Admitting: Endocrinology

## 2016-12-04 VITALS — BP 108/74 | HR 74 | Ht 65.0 in | Wt 293.2 lb

## 2016-12-04 DIAGNOSIS — N951 Menopausal and female climacteric states: Secondary | ICD-10-CM | POA: Diagnosis not present

## 2016-12-04 DIAGNOSIS — E890 Postprocedural hypothyroidism: Secondary | ICD-10-CM

## 2016-12-04 MED ORDER — LEVOTHYROXINE SODIUM 137 MCG PO TABS: 137.0000 ug | ORAL_TABLET | Freq: Every day | ORAL | 2 refills | Status: DC

## 2016-12-04 MED ORDER — CONJ ESTROG-MEDROXYPROGEST ACE 0.45-1.5 MG PO TABS: 1.0000 | ORAL_TABLET | Freq: Every day | ORAL | 2 refills | Status: DC

## 2016-12-04 NOTE — Progress Notes (Signed)
Patient ID: HARBOUR CONVEY, female   DOB: [birth year], 46 y.o.   MRN: [id number]            Referring Physician:Crawford  Reason for Appointment:  Hypothyroidism, follow-up visit     History of Present Illness:   Hypothyroidism was first diagnosed in 1996 Prior to this she had Graves' disease treated with I-131 for her hyperthyroidism but details of this are not available She thinks she was started on levothyroxine after thyroid ablation Again no prior records are available except since 2015 when she moved back here  She has been followed periodically by her PCP and has been mostly taking 175 g of levothyroxine Review of her labs show low normal TSH in 2015 and 2016 but mild increase in 2017, further increased to 11.77 in 6/18  Recent history: Her PCP had increased her dose up to 200 g before her visit in August However with this her TSH was low at 0.18 although she was asymptomatic  She was told to take 6-1/2 tablets a week on her Synthroid dosage in August However on her own she has been reducing the dose further and is reportedly taking 5 and a half tablets a week She does not feel any difference with the change in her regimen  She does not think she has any new symptoms of unusual fatigue, shakiness, heat or cold intolerance Has not changed the way she takes her supplement  She has had periodic fatigue related to her difficulty with sleep and depression  She does take her thyroid supplement very consistently at least an hour before breakfast but is also taking are other medication with this Does not take any vitamins at the same time  Her TSH is now significantly suppressed         Patient's weight history is as follows:  Wt Readings from Last 3 Encounters:  12/04/16 293 lb 3.2 oz (133 kg)  09/26/16 290 lb 9.6 oz (131.8 kg)  07/24/16 297 lb (134.7 kg)    Thyroid function results have been as follows:  Lab Results  Component Value Date   TSH 0.05 (L) 11/27/2016     TSH 0.18 (L) 09/26/2016   TSH 11.77 (H) 07/24/2016   TSH 5.05 (H) 08/16/2015   FREET4 1.13 11/27/2016   FREET4 1.32 09/26/2016   FREET4 0.74 07/24/2016   FREET4 0.72 08/16/2015   T3FREE 3.4 07/24/2016     Past Medical History:  Diagnosis Date  . Arthritis   . Depression   . Disc disease with myelopathy, lumbar   . Hypertension   . Muscle spasms of both lower extremities   . Thyroid cancer [facility])     Past Surgical History:  Procedure Laterality Date  . COLONOSCOPY     repair of fissures  . glands     under arms-removed    Family History  Problem Relation Age of Onset  . Diabetes Mother   . Hypertension Mother   . Diabetes Father   . Hypertension Father   . Heart failure Father     Social History:  reports that she has been smoking Cigarettes.  She has never used smokeless tobacco. She reports that she drinks alcohol. Her drug history is not on file.  Allergies: No Known Allergies  Allergies as of 12/04/2016   No Known Allergies     Medication List       Accurate as of 12/04/16 11:10 AM. Always use your most recent med list.  albuterol 108 (90 Base) MCG/ACT inhaler Commonly known as:  PROVENTIL HFA;VENTOLIN HFA Inhale 2 puffs into the lungs every 6 (six) hours as needed for wheezing or shortness of breath.   amoxicillin 500 MG capsule Commonly known as:  AMOXIL Take 500 mg by mouth 3 (three) times daily.   cetirizine 10 MG tablet Commonly known as:  ZYRTEC TAKE 1 TABLET BY MOUTH DAILY   cyclobenzaprine 5 MG tablet Commonly known as:  FLEXERIL Take 1 tablet (5 mg total) by mouth 3 (three) times daily as needed for muscle spasms.   levothyroxine 200 MCG tablet Commonly known as:  SYNTHROID, LEVOTHROID TAKE 1 TABLET BY MOUTH DAILY BEFORE BREAKFAST   lisinopril 10 MG tablet Commonly known as:  PRINIVIL,ZESTRIL Take 1 tablet (10 mg total) by mouth daily.   naproxen 500 MG tablet Commonly known as:  NAPROSYN Take 1 tablet (500 mg  total) by mouth 2 (two) times daily between meals as needed.   NIFEdipine 60 MG 24 hr tablet Commonly known as:  PROCARDIA-XL/ADALAT CC Take 1 tablet (60 mg total) by mouth daily.   oxyCODONE-acetaminophen 10-325 MG tablet Commonly known as:  PERCOCET   ranitidine 150 MG tablet Commonly known as:  ZANTAC TAKE 1 TABLET BY MOUTH TWICE DAILY          Review of Systems      She is complaining about hot flashes and sweating spells that have been persisting She has decreased menstrual cycles and they are coming very infrequently She has not discussed treatment with her PCP She has not had a hysterectomy        Examination:    BP 108/74   Pulse 74   Ht 5\' 5"  (1.651 m)   Wt 293 lb 3.2 oz (133 kg)   SpO2 98%   BMI 48.79 kg/m   Biceps reflexes normal No tremor Heartrate regular   Assessment:  HYPOTHYROIDISM, 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 She is requiring variable doses over the last year for supplementation  Although her dose had to be increased up to 200 g in June she appears been needing progressively lower doses again Patient is also adjusting her medication on her own without consulting [place] and is reportedly taking only 5-1/2 tablets a week This is equivalent to about 154 g average daily She is euthyroid on exam but her TSH is 0.05 only  MENOPAUSAL symptoms: She is having significant hot flashes and sweating episodes, currently not on treatment  PLAN:   She will reduce her Synthroid dose down to 137 She will take this daily and not change the medication without follow-up labs  Follow-up in 6 weeks  She was told to empirically try Prempro 0.45 daily for her hot flashes and sweating  Total visit time 15 minutes  Hansen Carino 12/04/2016, 11:10 AM     Note: This office note was prepared with Insurance underwriter. Any transcriptional errors that result from this process are unintentional.

## 2016-12-04 NOTE — Telephone Encounter (Signed)
Patient did not receive hormone pill prescription. Please send prescription for the hormone pills to [facility] ([place])

## 2016-12-05 NOTE — Telephone Encounter (Signed)
I called the [facility] and spoke to [facility] and he stated that the Prempro needs a PA. Do you want to proceed? Or do you want to send something else? He stated her insurance did not give an alternative. Please advise.

## 2016-12-05 NOTE — Telephone Encounter (Signed)
Please advise. Was it the Prempro?

## 2016-12-05 NOTE — Telephone Encounter (Signed)
Opened in error

## 2016-12-05 NOTE — Telephone Encounter (Signed)
I see the prescription has been done, make sure it went to the pharmacy

## 2016-12-05 NOTE — Telephone Encounter (Signed)
Recent prescription for estradiol 0.5 mg daily along with Provera 10 mg the first 10 days of each month.  She will have menstrual cycles with this

## 2016-12-06 ENCOUNTER — Other Ambulatory Visit: Payer: Self-pay

## 2016-12-06 ENCOUNTER — Other Ambulatory Visit: Payer: Self-pay | Admitting: Endocrinology

## 2016-12-06 MED ORDER — MEDROXYPROGESTERONE ACETATE 2.5 MG PO TABS: 2.5000 mg | ORAL_TABLET | Freq: Every day | ORAL | 1 refills | Status: DC

## 2016-12-06 MED ORDER — ESTRADIOL 0.05 MG/24HR TD PTWK: 0.0500 mg | MEDICATED_PATCH | TRANSDERMAL | 1 refills | Status: DC

## 2016-12-06 NOTE — Telephone Encounter (Signed)
For convenience prescription has been sent for Provera 2.5 mg daily and also estradiol patch 0.05 mg weekly, let her know that they should be Generic prescriptions

## 2016-12-06 NOTE — Telephone Encounter (Signed)
If these are new prescriptions for this patient please send these to the pharmacy for patient. You are more familiar with these medications than I am and I want to make sure she receives the correct prescriptions. Thank you.

## 2016-12-06 NOTE — Telephone Encounter (Signed)
Called patient and left a voice message to let her know the name of the 2 new prescriptions and that these have been sent to the pharmacy for her.

## 2016-12-10 ENCOUNTER — Telehealth: Payer: Self-pay

## 2016-12-10 NOTE — Telephone Encounter (Signed)
Left vm requesting the patient call her insurance company to find out what is covered to take the place of prempro which needs a PA- I requested the patient call back when she knows what is covered

## 2016-12-17 ENCOUNTER — Other Ambulatory Visit: Payer: Self-pay | Admitting: Internal Medicine

## 2016-12-17 DIAGNOSIS — N6310 Unspecified lump in the right breast, unspecified quadrant: Secondary | ICD-10-CM

## 2017-01-15 ENCOUNTER — Other Ambulatory Visit: Payer: Medicare Other

## 2017-01-22 ENCOUNTER — Ambulatory Visit: Payer: Medicare Other | Admitting: Endocrinology

## 2017-02-19 ENCOUNTER — Ambulatory Visit: Payer: Medicare Other

## 2017-02-19 ENCOUNTER — Ambulatory Visit
Admission: RE | Admit: 2017-02-19 | Discharge: 2017-02-19 | Disposition: A | Discharge status: Home / Self Care | Payer: Medicare Other | Source: Ambulatory Visit | Attending: Internal Medicine | Admitting: Internal Medicine

## 2017-02-19 DIAGNOSIS — R928 Other abnormal and inconclusive findings on diagnostic imaging of breast: Secondary | ICD-10-CM | POA: Diagnosis not present

## 2017-02-19 DIAGNOSIS — N6310 Unspecified lump in the right breast, unspecified quadrant: Secondary | ICD-10-CM

## 2017-02-26 ENCOUNTER — Other Ambulatory Visit (INDEPENDENT_AMBULATORY_CARE_PROVIDER_SITE_OTHER): Payer: Medicare Other

## 2017-02-26 DIAGNOSIS — E890 Postprocedural hypothyroidism: Secondary | ICD-10-CM | POA: Diagnosis not present

## 2017-02-26 LAB — T4, FREE: Free T4: 1.08 ng/dL (ref 0.60–1.60)

## 2017-02-26 LAB — TSH: TSH: 2.06 u[IU]/mL (ref 0.35–4.50)

## 2017-03-05 ENCOUNTER — Encounter: Payer: Self-pay | Admitting: Endocrinology

## 2017-03-05 ENCOUNTER — Ambulatory Visit (INDEPENDENT_AMBULATORY_CARE_PROVIDER_SITE_OTHER): Payer: Medicare Other | Admitting: Endocrinology

## 2017-03-05 VITALS — BP 127/96 | HR 111 | Temp 98.7°F | Ht 65.0 in | Wt 286.6 lb

## 2017-03-05 DIAGNOSIS — E890 Postprocedural hypothyroidism: Secondary | ICD-10-CM

## 2017-03-05 DIAGNOSIS — N951 Menopausal and female climacteric states: Secondary | ICD-10-CM

## 2017-03-05 NOTE — Progress Notes (Signed)
Patient ID: Tonya Greene, female   DOB: 08/12/1970, 47 y.o.   MRN: [id number]            Referring Physician:Crawford  Reason for Appointment:  Hypothyroidism, follow-up visit     History of Present Illness:   Hypothyroidism was first diagnosed in 1996 Prior to this she had Graves' disease treated with I-131 for her hyperthyroidism but details of this are not available She thinks she was started on levothyroxine after thyroid ablation Again no prior records are available except since 2015 when she moved back here  She has been followed periodically by her PCP and has been mostly taking 175 g of levothyroxine Review of her labs show low normal TSH in 2015 and 2016 but mild increase in 2017, further increased to 11.77 in 6/18  Recent history: Her PCP had increased her dose up to 200 g before her visit in August However with this her TSH was low at 0.18 although she was asymptomatic  Her levothyroxine doses were progressively reduced and she is now taking only 137 g daily However with this change she does not think she feels any different subjectively  She does not think she has any new symptoms of unusual fatigue, shakiness, heat or cold intolerance Has not changed the way she takes her supplement  She has had periodic fatigue which is not changed She has lost weight since her last visit No complaints of heat or cold intolerance  She does take her thyroid supplement very consistently at least an hour before breakfast but is also taking are other medication with this Does not take any vitamins at the same time  Her TSH is now back to normal         Patient's weight history is as follows:  Wt Readings from Last 3 Encounters:  03/05/17 286 lb 9.6 oz (130 kg)  12/04/16 293 lb 3.2 oz (133 kg)  09/26/16 290 lb 9.6 oz (131.8 kg)    Thyroid function results have been as follows:  Lab Results  Component Value Date   TSH 2.06 02/26/2017   TSH 0.05 (L) 11/27/2016   TSH  0.18 (L) 09/26/2016   TSH 11.77 (H) 07/24/2016   FREET4 1.08 02/26/2017   FREET4 1.13 11/27/2016   FREET4 1.32 09/26/2016   FREET4 0.74 07/24/2016   T3FREE 3.4 07/24/2016     Past Medical History:  Diagnosis Date  . Arthritis   . Depression   . Disc disease with myelopathy, lumbar   . Hypertension   . Muscle spasms of both lower extremities   . Thyroid cancer [facility])     Past Surgical History:  Procedure Laterality Date  . COLONOSCOPY     repair of fissures  . glands     under arms-removed    Family History  Problem Relation Age of Onset  . Diabetes Mother   . Hypertension Mother   . Diabetes Father   . Hypertension Father   . Heart failure Father     Social History:  reports that she has been smoking cigarettes.  she has never used smokeless tobacco. She reports that she drinks alcohol. Her drug history is not on file.  Allergies: No Known Allergies  Allergies as of 03/05/2017   No Known Allergies     Medication List        Accurate as of 03/05/17  2:08 PM. Always use your most recent med list.          albuterol 108 (90  Base) MCG/ACT inhaler Commonly known as:  PROVENTIL HFA;VENTOLIN HFA Inhale 2 puffs into the lungs every 6 (six) hours as needed for wheezing or shortness of breath.   amoxicillin 500 MG capsule Commonly known as:  AMOXIL Take 500 mg by mouth 3 (three) times daily.   cetirizine 10 MG tablet Commonly known as:  ZYRTEC TAKE 1 TABLET BY MOUTH DAILY   cyclobenzaprine 5 MG tablet Commonly known as:  FLEXERIL Take 1 tablet (5 mg total) by mouth 3 (three) times daily as needed for muscle spasms.   levothyroxine 137 MCG tablet Commonly known as:  SYNTHROID Take 1 tablet (137 mcg total) by mouth daily before breakfast.   lisinopril 10 MG tablet Commonly known as:  PRINIVIL,ZESTRIL Take 1 tablet (10 mg total) by mouth daily.   naproxen 500 MG tablet Commonly known as:  NAPROSYN Take 1 tablet (500 mg total) by mouth 2 (two) times daily  between meals as needed.   NIFEdipine 60 MG 24 hr tablet Commonly known as:  PROCARDIA-XL/ADALAT CC Take 1 tablet (60 mg total) by mouth daily.   oxyCODONE-acetaminophen 10-325 MG tablet Commonly known as:  PERCOCET   ranitidine 150 MG tablet Commonly known as:  ZANTAC TAKE 1 TABLET BY MOUTH TWICE DAILY          Review of Systems      She is complaining about hot flashes and sweating spells that have been persisting Has not had any menstrual cycle for 2 months and periodically was having oligomenorrhea also   She has difficulty sleeping because of the symptoms She was given a prescription for HRT on the last visit but she did not start it for fear of side effects She said she read on the package insert for the patches that that would cause cancer and she did not start this        Examination:    BP (!) 127/96 (BP Location: Left Arm, Patient Position: Sitting, Cuff Size: Normal)   Pulse (!) 111   Temp 98.7 F (37.1 C) (Oral)   Ht 5\' 5"  (1.651 m)   Wt 286 lb 9.6 oz (130 kg)   LMP 02/05/2017 (Approximate)   SpO2 98%   BMI 47.69 kg/m   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 She had been requiring variable doses over the last year for supplementation  Although her dose had to be increased up to 200 g in June 2018she appears been needing progressively lower doses since then Now with taking only 137 g daily her TSH is back to normal She is not having any significant subjective symptoms with her variable thyroid levels  MENOPAUSAL symptoms: She is having significant hot flashes and sweating episodes, currently not on treatment, have discussed short-term treatment for relief of symptoms is safe and reassured her that she can take the treatment for as long as needed  PLAN:   No change in Synthroid  New prescription for CombiPatch has been  sent  Follow-up in 6 weeks   Total visit time 15 minutes  Gerhart Ruggieri 03/05/2017, 2:08 PM     Note: This office note was prepared with Dragon voice recognition system technology. Any transcriptional errors that result from this process are unintentional.

## 2017-03-06 MED ORDER — ESTRADIOL-NORETHINDRONE ACET 0.05-0.14 MG/DAY TD PTTW: 1.0000 | MEDICATED_PATCH | TRANSDERMAL | 3 refills | Status: DC

## 2017-03-12 ENCOUNTER — Other Ambulatory Visit: Payer: Medicare Other

## 2017-03-19 ENCOUNTER — Ambulatory Visit: Payer: Medicare Other | Admitting: Endocrinology

## 2017-03-28 ENCOUNTER — Other Ambulatory Visit: Payer: Self-pay | Admitting: Endocrinology

## 2017-03-28 ENCOUNTER — Other Ambulatory Visit: Payer: Self-pay | Admitting: Nurse Practitioner

## 2017-03-28 DIAGNOSIS — I10 Essential (primary) hypertension: Secondary | ICD-10-CM

## 2017-03-28 DIAGNOSIS — M519 Unspecified thoracic, thoracolumbar and lumbosacral intervertebral disc disorder: Secondary | ICD-10-CM

## 2017-04-19 ENCOUNTER — Telehealth: Payer: Self-pay | Admitting: *Deleted

## 2017-04-19 NOTE — Telephone Encounter (Signed)
Combipatch PA is approved.

## 2017-04-19 NOTE — Telephone Encounter (Signed)
PA for CombiPatch 0.05-0.14 mg/day patch initiated via CoverMyMeds.

## 2017-04-23 ENCOUNTER — Telehealth: Payer: Self-pay

## 2017-04-23 NOTE — Telephone Encounter (Signed)
Combipatch PA completed and approved from 02/05/2017-04/19/2018.

## 2017-06-07 ENCOUNTER — Other Ambulatory Visit: Payer: Self-pay | Admitting: Endocrinology

## 2017-06-19 ENCOUNTER — Other Ambulatory Visit: Payer: Self-pay

## 2017-06-19 MED ORDER — CETIRIZINE HCL 10 MG PO TABS: 10.0000 mg | ORAL_TABLET | Freq: Every day | ORAL | 0 refills | Status: AC

## 2017-07-09 ENCOUNTER — Other Ambulatory Visit: Payer: Self-pay | Admitting: Nurse Practitioner

## 2017-07-09 DIAGNOSIS — K219 Gastro-esophageal reflux disease without esophagitis: Secondary | ICD-10-CM

## 2017-08-06 ENCOUNTER — Other Ambulatory Visit (INDEPENDENT_AMBULATORY_CARE_PROVIDER_SITE_OTHER): Payer: Medicare Other

## 2017-08-06 DIAGNOSIS — E890 Postprocedural hypothyroidism: Secondary | ICD-10-CM | POA: Diagnosis not present

## 2017-08-06 LAB — T4, FREE: FREE T4: 1.27 ng/dL (ref 0.60–1.60)

## 2017-08-06 LAB — TSH: TSH: 0.41 u[IU]/mL (ref 0.35–4.50)

## 2017-08-13 ENCOUNTER — Ambulatory Visit: Payer: Medicare Other | Admitting: Endocrinology

## 2017-08-31 ENCOUNTER — Other Ambulatory Visit: Payer: Self-pay | Admitting: Internal Medicine

## 2017-08-31 DIAGNOSIS — I10 Essential (primary) hypertension: Secondary | ICD-10-CM

## 2017-10-01 ENCOUNTER — Ambulatory Visit: Payer: Medicare Other | Admitting: Endocrinology

## 2017-10-01 DIAGNOSIS — Z0289 Encounter for other administrative examinations: Secondary | ICD-10-CM

## 2017-10-02 ENCOUNTER — Other Ambulatory Visit: Payer: Self-pay | Admitting: Endocrinology

## 2017-11-04 ENCOUNTER — Other Ambulatory Visit: Payer: Self-pay | Admitting: Internal Medicine

## 2017-11-04 ENCOUNTER — Other Ambulatory Visit: Payer: Self-pay | Admitting: Nurse Practitioner

## 2017-11-04 ENCOUNTER — Other Ambulatory Visit: Payer: Self-pay | Admitting: Endocrinology

## 2017-11-04 DIAGNOSIS — I10 Essential (primary) hypertension: Secondary | ICD-10-CM

## 2017-11-04 DIAGNOSIS — K219 Gastro-esophageal reflux disease without esophagitis: Secondary | ICD-10-CM

## 2017-12-22 NOTE — Progress Notes (Deleted)
Patient ID: Tonya Greene, female   DOB: 1970-05-20, 47 y.o.   MRN: [id number]            Referring Physician:Crawford  Reason for Appointment:  Hypothyroidism, follow-up visit     History of Present Illness:   Hypothyroidism was first diagnosed in 1996 Prior to this she had Graves' disease treated with I-131 for her hyperthyroidism but details of this are not available She thinks she was started on levothyroxine after thyroid ablation Again no prior records are available except since 2015 when she moved back here  She has been followed periodically by her PCP and has been mostly taking 175 g of levothyroxine Review of her labs show low normal TSH in 2015 and 2016 but mild increase in 2017, further increased to 11.77 in 6/18  Recent history: Her PCP had increased her dose up to 200 g before her visit in August However with this her TSH was low at 0.18 although she was asymptomatic  Her levothyroxine doses were progressively reduced and she is now taking only 137 g daily However with this change she does not think she feels any different subjectively  She does not think she has any new symptoms of unusual fatigue, shakiness, heat or cold intolerance Has not changed the way she takes her supplement  She has had periodic fatigue which is not changed She has lost weight since her last visit No complaints of heat or cold intolerance  She does take her thyroid supplement very consistently at least an hour before breakfast but is also taking are other medication with this Does not take any vitamins at the same time  Her TSH is now back to normal         Patient's weight history is as follows:  Wt Readings from Last 3 Encounters:  03/05/17 286 lb 9.6 oz (130 kg)  12/04/16 293 lb 3.2 oz (133 kg)  09/26/16 290 lb 9.6 oz (131.8 kg)    Thyroid function results have been as follows:  Lab Results  Component Value Date   TSH 0.41 08/06/2017   TSH 2.06 02/26/2017   TSH 0.05  (L) 11/27/2016   TSH 0.18 (L) 09/26/2016   FREET4 1.27 08/06/2017   FREET4 1.08 02/26/2017   FREET4 1.13 11/27/2016   FREET4 1.32 09/26/2016   T3FREE 3.4 07/24/2016     Past Medical History:  Diagnosis Date  . Arthritis   . Depression   . Disc disease with myelopathy, lumbar   . Hypertension   . Muscle spasms of both lower extremities   . Thyroid cancer [facility])     Past Surgical History:  Procedure Laterality Date  . COLONOSCOPY     repair of fissures  . glands     under arms-removed    Family History  Problem Relation Age of Onset  . Diabetes Mother   . Hypertension Mother   . Diabetes Father   . Hypertension Father   . Heart failure Father     Social History:  reports that she has been smoking cigarettes. She has never used smokeless tobacco. She reports that she drinks alcohol. Her drug history is not on file.  Allergies: No Known Allergies  Allergies as of 12/23/2017   No Known Allergies     Medication List        Accurate as of 12/22/17  8:29 PM. Always use your most recent med list.          albuterol 108 (90 Base) MCG/ACT  inhaler Commonly known as:  PROVENTIL HFA;VENTOLIN HFA Inhale 2 puffs into the lungs every 6 (six) hours as needed for wheezing or shortness of breath.   amoxicillin 500 MG capsule Commonly known as:  AMOXIL Take 500 mg by mouth 3 (three) times daily.   cetirizine 10 MG tablet Commonly known as:  ZYRTEC Take 1 tablet (10 mg total) by mouth daily. Need annual visit for further refills   cyclobenzaprine 5 MG tablet Commonly known as:  FLEXERIL Take 1 tablet (5 mg total) by mouth 3 (three) times daily as needed for muscle spasms.   estradiol-norethindrone 0.05-0.14 MG/DAY Commonly known as:  COMBIPATCH Place 1 patch onto the skin 2 (two) times a week.   levothyroxine 137 MCG tablet Commonly known as:  SYNTHROID, LEVOTHROID TAKE 1 TABLET BY MOUTH DAILY BEFORE BREAKFAST   lisinopril 10 MG tablet Commonly known as:   PRINIVIL,ZESTRIL TAKE 1 TABLET BY MOUTH DAILY   naproxen 500 MG tablet Commonly known as:  NAPROSYN TAKE 1 TABLET BY MOUTH TWICE DAILY BETWEEN MEALS AS NEEDED   NIFEdipine 60 MG 24 hr tablet Commonly known as:  ADALAT CC TAKE 1 TABLET BY MOUTH DAILY   oxyCODONE-acetaminophen 10-325 MG tablet Commonly known as:  PERCOCET   ranitidine 150 MG tablet Commonly known as:  ZANTAC TAKE 1 TABLET BY MOUTH TWICE DAILY          Review of Systems      She is complaining about hot flashes and sweating spells that have been persisting Has not had any menstrual cycle for 2 months and periodically was having oligomenorrhea also   She has difficulty sleeping because of the symptoms She was given a prescription for HRT on the last visit but she did not start it for fear of side effects She said she read on the package insert for the patches that that would cause cancer and she did not start this        Examination:    There were no vitals taken for this visit.  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 She had been requiring variable doses over the last year for supplementation  Although her dose had to be increased up to 200 g in June 2018she appears been needing progressively lower doses since then Now with taking only 137 g daily her TSH is back to normal She is not having any significant subjective symptoms with her variable thyroid levels  MENOPAUSAL symptoms: She is having significant hot flashes and sweating episodes, currently not on treatment, have discussed short-term treatment for relief of symptoms is safe and reassured her that she can take the treatment for as long as needed  PLAN:   No change in Synthroid  New prescription for CombiPatch has been sent  Follow-up in 6 weeks   Total visit time 15 minutes  Jannett Schmall 12/22/2017, 8:29 PM      Note: This office note was prepared with Dragon voice recognition system technology. Any transcriptional errors that result from this process are unintentional.

## 2017-12-23 ENCOUNTER — Ambulatory Visit: Payer: Medicare Other | Admitting: Endocrinology

## 2018-01-03 ENCOUNTER — Other Ambulatory Visit: Payer: Self-pay | Admitting: Endocrinology

## 2018-02-18 DIAGNOSIS — K219 Gastro-esophageal reflux disease without esophagitis: Secondary | ICD-10-CM | POA: Diagnosis not present

## 2018-02-18 DIAGNOSIS — F321 Major depressive disorder, single episode, moderate: Secondary | ICD-10-CM | POA: Diagnosis not present

## 2018-02-18 DIAGNOSIS — M79671 Pain in right foot: Secondary | ICD-10-CM | POA: Diagnosis not present

## 2018-02-18 DIAGNOSIS — I10 Essential (primary) hypertension: Secondary | ICD-10-CM | POA: Diagnosis not present

## 2018-02-18 DIAGNOSIS — F419 Anxiety disorder, unspecified: Secondary | ICD-10-CM | POA: Diagnosis not present

## 2018-03-04 DIAGNOSIS — K219 Gastro-esophageal reflux disease without esophagitis: Secondary | ICD-10-CM | POA: Diagnosis not present

## 2018-03-04 DIAGNOSIS — I10 Essential (primary) hypertension: Secondary | ICD-10-CM | POA: Diagnosis not present

## 2018-03-04 DIAGNOSIS — M79671 Pain in right foot: Secondary | ICD-10-CM | POA: Diagnosis not present

## 2018-03-21 ENCOUNTER — Other Ambulatory Visit: Payer: Self-pay | Admitting: Family Medicine

## 2018-03-21 DIAGNOSIS — Z1231 Encounter for screening mammogram for malignant neoplasm of breast: Secondary | ICD-10-CM

## 2018-04-23 ENCOUNTER — Ambulatory Visit: Payer: Medicare Other

## 2018-05-13 ENCOUNTER — Ambulatory Visit: Payer: Medicaid Other | Admitting: Endocrinology

## 2018-08-04 ENCOUNTER — Encounter (HOSPITAL_COMMUNITY): Payer: Medicare Other

## 2018-08-04 ENCOUNTER — Other Ambulatory Visit: Payer: Self-pay

## 2018-08-04 ENCOUNTER — Encounter (HOSPITAL_COMMUNITY): Payer: Self-pay | Admitting: Emergency Medicine

## 2018-08-04 ENCOUNTER — Emergency Department (HOSPITAL_COMMUNITY)
Admission: EM | Admit: 2018-08-04 | Discharge: 2018-08-04 | Disposition: A | Discharge status: Home / Self Care | Payer: Medicare Other | Attending: Emergency Medicine | Admitting: Emergency Medicine

## 2018-08-04 DIAGNOSIS — M62838 Other muscle spasm: Secondary | ICD-10-CM | POA: Insufficient documentation

## 2018-08-04 DIAGNOSIS — F17210 Nicotine dependence, cigarettes, uncomplicated: Secondary | ICD-10-CM | POA: Diagnosis not present

## 2018-08-04 DIAGNOSIS — R2241 Localized swelling, mass and lump, right lower limb: Secondary | ICD-10-CM | POA: Diagnosis not present

## 2018-08-04 DIAGNOSIS — I10 Essential (primary) hypertension: Secondary | ICD-10-CM | POA: Diagnosis not present

## 2018-08-04 DIAGNOSIS — Z79899 Other long term (current) drug therapy: Secondary | ICD-10-CM | POA: Insufficient documentation

## 2018-08-04 DIAGNOSIS — Z85850 Personal history of malignant neoplasm of thyroid: Secondary | ICD-10-CM | POA: Insufficient documentation

## 2018-08-04 LAB — CBC WITH DIFFERENTIAL/PLATELET
Abs Immature Granulocytes: 0.02 10*3/uL (ref 0.00–0.07)
Basophils Absolute: 0 10*3/uL (ref 0.0–0.1)
Basophils Relative: 0 %
Eosinophils Absolute: 0.2 10*3/uL (ref 0.0–0.5)
Eosinophils Relative: 3 %
HCT: 42.4 % (ref 36.0–46.0)
Hemoglobin: 13.6 g/dL (ref 12.0–15.0)
Immature Granulocytes: 0 %
Lymphocytes Relative: 41 %
Lymphs Abs: 2.5 10*3/uL (ref 0.7–4.0)
MCH: 32.2 pg (ref 26.0–34.0)
MCHC: 32.1 g/dL (ref 30.0–36.0)
MCV: 100.5 fL — ABNORMAL HIGH (ref 80.0–100.0)
Monocytes Absolute: 0.5 10*3/uL (ref 0.1–1.0)
Monocytes Relative: 8 %
Neutro Abs: 2.9 10*3/uL (ref 1.7–7.7)
Neutrophils Relative %: 48 %
Platelets: 382 10*3/uL (ref 150–400)
RBC: 4.22 MIL/uL (ref 3.87–5.11)
RDW: 13.1 % (ref 11.5–15.5)
WBC: 6 10*3/uL (ref 4.0–10.5)
nRBC: 0 % (ref 0.0–0.2)

## 2018-08-04 LAB — COMPREHENSIVE METABOLIC PANEL
ALT: 20 U/L (ref 0–44)
AST: 24 U/L (ref 15–41)
Albumin: 4 g/dL (ref 3.5–5.0)
Alkaline Phosphatase: 62 U/L (ref 38–126)
Anion gap: 7 (ref 5–15)
BUN: 6 mg/dL (ref 6–20)
CO2: 26 mmol/L (ref 22–32)
Calcium: 9.5 mg/dL (ref 8.9–10.3)
Chloride: 107 mmol/L (ref 98–111)
Creatinine, Ser: 0.55 mg/dL (ref 0.44–1.00)
GFR calc Af Amer: >60 mL/min (ref >60)
GFR calc non Af Amer: >60 mL/min (ref >60)
Glucose, Bld: 77 mg/dL (ref 70–99)
Potassium: 4.3 mmol/L (ref 3.5–5.1)
Sodium: 140 mmol/L (ref 135–145)
Total Bilirubin: <0.1 mg/dL — ABNORMAL LOW (ref 0.3–1.2)
Total Protein: 7.1 g/dL (ref 6.5–8.1)

## 2018-08-04 LAB — TSH: TSH: 0.907 u[IU]/mL (ref 0.350–4.500)

## 2018-08-04 MED ORDER — OXYCODONE-ACETAMINOPHEN 5-325 MG PO TABS: 1.0000 | ORAL_TABLET | Freq: Four times a day (QID) | ORAL | 0 refills | Status: DC | PRN

## 2018-08-04 MED ORDER — KETOROLAC TROMETHAMINE 30 MG/ML IJ SOLN
60.0000 mg | Freq: Once | INTRAMUSCULAR | Status: AC
  Administered 2018-08-04: 60 mg via INTRAMUSCULAR
  Filled 2018-08-04: qty 2

## 2018-08-04 NOTE — ED Notes (Addendum)
Patient states she is out of all of her medications for 2 weeks. Dr Jackson Latino is PCP. Dr Dwyane Dee at [facility] monitors her thyroid. States she split her Synthroid she had at home because she "needed Sythroid in her body" Has not been to Dr Jackson Latino or Dr Dwyane Dee for medications. Hasn't not had labs drawn for thyroid recently. Patient states she has "tingling and burning in feet and just tingling in hands." Feet appear to be swollen. Has not ran out of her BP meds, takes Lisinopril and Nephedipine(sp?) has taken extra pills due to swelling.

## 2018-08-04 NOTE — Discharge Instructions (Addendum)
Follow-up with your doctor this week for recheck as planned

## 2018-08-04 NOTE — ED Triage Notes (Signed)
Pt states for over month has spasms that start in abd then spread all over body. And pains in her sides when she lays down. Pt states that she has been traveling in a truck all over from [place], [place], [place], [place].

## 2018-08-04 NOTE — ED Provider Notes (Signed)
[place] DEPT Provider Note   CSN: [id number] Arrival date & time: 08/04/18  1355     History   Chief Complaint Chief Complaint  Patient presents with  . Spasms  . Generalized Body Aches    HPI ARRA CONNAUGHTON is a 48 y.o. female.     Patient complains of muscle spasms in the right leg along with some swelling.  She has had this problem before  The history is provided by the patient. No language interpreter was used.  Leg Pain Lower extremity pain location: Right thigh. Injury: no   Pain details:    Quality:  Aching   Radiates to:  Does not radiate   Severity:  Moderate   Onset quality:  Sudden   Timing:  Constant   Progression:  Worsening Chronicity:  Recurrent Dislocation: no   Associated symptoms: no back pain and no fatigue     Past Medical History:  Diagnosis Date  . Arthritis   . Depression   . Disc disease with myelopathy, lumbar   . Hypertension   . Muscle spasms of both lower extremities   . Thyroid cancer [facility])     Patient Active Problem List   Diagnosis Date Noted  . 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 GERD (gastroesophageal reflux disease) 07/24/2016  . Ganglion cyst of right foot 04/12/2015  . Graves disease 10/29/2013  . Benign essential HTN 10/29/2013  . Lumbar disc disease 10/29/2013  . Morbid obesity with BMI of 45.0-49.9, adult ([place]) 10/29/2013    Past Surgical History:  Procedure Laterality Date  . COLONOSCOPY     repair of fissures  . glands     under arms-removed     OB History   No obstetric history on file.      Home Medications    Prior to Admission medications   Medication Sig Start Date End Date Taking? Authorizing Provider  albuterol (PROVENTIL HFA;VENTOLIN HFA) 108 (90 Base) MCG/ACT inhaler Inhale 2 puffs into the lungs every 6 (six) hours as needed for wheezing or shortness of breath. 08/16/15   Hoyt Koch, MD  amoxicillin (AMOXIL) 500 MG capsule Take 500 mg by  mouth 3 (three) times daily.    [provider]  cetirizine (ZYRTEC) 10 MG tablet Take 1 tablet (10 mg total) by mouth daily. Need annual visit for further refills 06/19/17   Hoyt Koch, MD  cyclobenzaprine (FLEXERIL) 5 MG tablet Take 1 tablet (5 mg total) by mouth 3 (three) times daily as needed for muscle spasms. 07/24/16   Nche, Charlene Brooke, NP  estradiol-norethindrone [facility]) 0.05-0.14 MG/DAY Place 1 patch onto the skin 2 (two) times a week. 03/07/17   [name], MD  levothyroxine (SYNTHROID, LEVOTHROID) 137 MCG tablet TAKE 1 TABLET BY MOUTH DAILY BEFORE BREAKFAST 01/03/18   [name], MD  lisinopril (PRINIVIL,ZESTRIL) 10 MG tablet TAKE 1 TABLET BY MOUTH DAILY 03/28/17   Hoyt Koch, MD  naproxen (NAPROSYN) 500 MG tablet TAKE 1 TABLET BY MOUTH TWICE DAILY BETWEEN MEALS AS NEEDED 03/28/17   Hoyt Koch, MD  NIFEdipine (PROCARDIA-XL/ADALAT CC) 60 MG 24 hr tablet TAKE 1 TABLET BY MOUTH DAILY 03/28/17   Hoyt Koch, MD  oxyCODONE-acetaminophen (PERCOCET) 10-325 MG tablet  07/03/16   [provider]  ranitidine (ZANTAC) 150 MG tablet TAKE 1 TABLET BY MOUTH TWICE DAILY 07/24/16   Nche, Charlene Brooke, NP    Family History Family History  Problem Relation Age of Onset  . Diabetes Mother   .  Hypertension Mother   . Diabetes Father   . Hypertension Father   . Heart failure Father     Social History Social History   Tobacco Use  . Smoking status: Current Every Day Smoker    Types: Cigarettes  . Smokeless tobacco: Never Used  Substance Use Topics  . Alcohol use: Yes  . Drug use: Not on file     Allergies   Patient has no known allergies.   Review of Systems Review of Systems  Constitutional: Negative for appetite change and fatigue.  HENT: Negative for congestion, ear discharge and sinus pressure.   Eyes: Negative for discharge.  Respiratory: Negative for cough.   Cardiovascular: Negative for chest pain.   Gastrointestinal: Negative for abdominal pain and diarrhea.  Genitourinary: Negative for frequency and hematuria.  Musculoskeletal: Negative for back pain.       Right leg pain  Skin: Negative for rash.  Neurological: Negative for seizures and headaches.  Psychiatric/Behavioral: Negative for hallucinations.     Physical Exam Updated Vital Signs BP (!) 125/103   Pulse 71   Temp 98.2 F (36.8 C) (Oral)   Resp 20   SpO2 100%   Physical Exam Vitals signs and nursing note reviewed.  Constitutional:      Appearance: She is well-developed.  HENT:     Head: Normocephalic.     Nose: Nose normal.  Eyes:     General: No scleral icterus.    Conjunctiva/sclera: Conjunctivae normal.  Neck:     Musculoskeletal: Neck supple.     Thyroid: No thyromegaly.  Cardiovascular:     Rate and Rhythm: Normal rate and regular rhythm.     Heart sounds: No murmur. No friction rub. No gallop.   Pulmonary:     Breath sounds: No stridor. No wheezing or rales.  Chest:     Chest wall: No tenderness.  Abdominal:     General: There is no distension.     Tenderness: There is no abdominal tenderness. There is no rebound.  Musculoskeletal: Normal range of motion.     Comments: Tender thigh right leg with swelling lower part of the leg.  Lymphadenopathy:     Cervical: No cervical adenopathy.  Skin:    Findings: No erythema or rash.  Neurological:     Mental Status: She is oriented to person, place, and time.     Motor: No abnormal muscle tone.     Coordination: Coordination normal.  Psychiatric:        Behavior: Behavior normal.      ED Treatments / Results  Labs (all labs ordered are listed, but only abnormal results are displayed) Labs Reviewed  CBC WITH DIFFERENTIAL/PLATELET - Abnormal; Notable for the following components:      Result Value   MCV 100.5 (*)    All other components within normal limits  COMPREHENSIVE METABOLIC PANEL - Abnormal; Notable for the following components:    Total Bilirubin <0.1 (*)    All other components within normal limits  TSH    EKG    Radiology No results found.  Procedures Procedures (including critical care time)  Medications Ordered in ED Medications  ketorolac (TORADOL) 30 MG/ML injection 60 mg (60 mg Intramuscular Given 08/04/18 1520)     Initial Impression / Assessment and Plan / ED Course  I have reviewed the triage vital signs and the nursing notes.  Pertinent labs & imaging results that were available during my care of the patient were reviewed by me and  considered in my medical decision making (see chart for details).       Patient with muscle spasm and pain in right leg.  I told the patient we should get a DVT study to rule out a clot in her right leg.  She decided she did not want get that done.  She will follow-up with her PCP  Final Clinical Impressions(s) / ED Diagnoses   Final diagnoses:  None    ED Discharge Orders    None       Milton Ferguson, MD 08/04/18 1851

## 2018-08-13 DIAGNOSIS — R600 Localized edema: Secondary | ICD-10-CM | POA: Diagnosis not present

## 2018-08-13 DIAGNOSIS — I10 Essential (primary) hypertension: Secondary | ICD-10-CM | POA: Diagnosis not present

## 2018-08-13 DIAGNOSIS — F419 Anxiety disorder, unspecified: Secondary | ICD-10-CM | POA: Diagnosis not present

## 2018-08-13 DIAGNOSIS — F321 Major depressive disorder, single episode, moderate: Secondary | ICD-10-CM | POA: Diagnosis not present

## 2018-08-13 DIAGNOSIS — R252 Cramp and spasm: Secondary | ICD-10-CM | POA: Diagnosis not present

## 2018-08-13 DIAGNOSIS — E663 Overweight: Secondary | ICD-10-CM | POA: Diagnosis not present

## 2018-08-27 ENCOUNTER — Ambulatory Visit (INDEPENDENT_AMBULATORY_CARE_PROVIDER_SITE_OTHER): Payer: Medicare Other | Admitting: Endocrinology

## 2018-08-27 ENCOUNTER — Encounter: Payer: Self-pay | Admitting: Endocrinology

## 2018-08-27 ENCOUNTER — Other Ambulatory Visit: Payer: Self-pay

## 2018-08-27 DIAGNOSIS — E890 Postprocedural hypothyroidism: Secondary | ICD-10-CM | POA: Diagnosis not present

## 2018-08-27 NOTE — Progress Notes (Signed)
Patient ID: Tonya Greene, female   DOB: Sep 19, 1970, 48 y.o.   MRN: [id number]            Today's office visit was provided via telemedicine using video technique The patient was explained the limitations of evaluation and management by telemedicine and the availability of in person appointments.  The patient understood the limitations and agreed to proceed. Patient also understood that the telehealth visit is billable. . Location of the patient: Patient's home . Location of the provider: Physician office Only the patient and myself were participating in the encounter     Reason for Appointment:  Hypothyroidism, follow-up visit     History of Present Illness:   Hypothyroidism was first diagnosed in 1996 Prior to this she had Graves' disease treated with I-131 for her hyperthyroidism but details of this are not available She thinks she was started on levothyroxine after thyroid ablation Again no prior records are available except since 2015 when she moved back here  She has been followed periodically by her PCP and has been mostly taking 175 g of levothyroxine Review of her labs show low normal TSH in 2015 and 2016 but mild increase in 2017, further increased to 11.77 in 6/18  Recent history: She had previously been given doses up to 200 g before her initial visit in August 2018 However with this her TSH was low at 0.18  Her levothyroxine doses were progressively reduced and she is continuing to be taking only 137 g daily  She has not come back for her follow-up as indicated on her last visit in 02/2017  Although she had previously had difficulties with recurrent fatigue she is having other problems now including generalized swelling, muscle spasms and some weakness She feels well and cold at times No hot flashes currently  She still take her thyroid supplement very consistently at least an hour before breakfast without any iron or calcium  Her TSH is recently normal done  at the emergency room         Patient's weight history is as follows:  Wt Readings from Last 3 Encounters:  03/05/17 286 lb 9.6 oz (130 kg)  12/04/16 293 lb 3.2 oz (133 kg)  09/26/16 290 lb 9.6 oz (131.8 kg)    Thyroid function results have been as follows:  Lab Results  Component Value Date   TSH 0.907 08/04/2018   TSH 0.41 08/06/2017   TSH 2.06 02/26/2017   TSH 0.05 (L) 11/27/2016   FREET4 1.27 08/06/2017   FREET4 1.08 02/26/2017   FREET4 1.13 11/27/2016   FREET4 1.32 09/26/2016   T3FREE 3.4 07/24/2016     Past Medical History:  Diagnosis Date  . Arthritis   . Depression   . Disc disease with myelopathy, lumbar   . Hypertension   . Muscle spasms of both lower extremities   . Thyroid cancer [facility])     Past Surgical History:  Procedure Laterality Date  . COLONOSCOPY     repair of fissures  . glands     under arms-removed    Family History  Problem Relation Age of Onset  . Diabetes Mother   . Hypertension Mother   . Diabetes Father   . Hypertension Father   . Heart failure Father     Social History:  reports that she has been smoking cigarettes. She has never used smokeless tobacco. She reports current alcohol use. No history on file for drug.  Allergies: No Known Allergies  Allergies as of  08/27/2018   No Known Allergies     Medication List       Accurate as of August 27, 2018  8:09 AM. If you have any questions, ask your nurse or doctor.        albuterol 108 (90 Base) MCG/ACT inhaler Commonly known as: VENTOLIN HFA Inhale 2 puffs into the lungs every 6 (six) hours as needed for wheezing or shortness of breath.   amoxicillin 500 MG capsule Commonly known as: AMOXIL Take 500 mg by mouth 3 (three) times daily.   cetirizine 10 MG tablet Commonly known as: ZYRTEC Take 1 tablet (10 mg total) by mouth daily. Need annual visit for further refills   cyclobenzaprine 5 MG tablet Commonly known as: FLEXERIL Take 1 tablet (5 mg total) by mouth 3  (three) times daily as needed for muscle spasms.   estradiol-norethindrone 0.05-0.14 MG/DAY Commonly known as: CombiPatch Place 1 patch onto the skin 2 (two) times a week.   levothyroxine 137 MCG tablet Commonly known as: SYNTHROID TAKE 1 TABLET BY MOUTH DAILY BEFORE BREAKFAST   lisinopril 10 MG tablet Commonly known as: ZESTRIL TAKE 1 TABLET BY MOUTH DAILY   naproxen 500 MG tablet Commonly known as: NAPROSYN TAKE 1 TABLET BY MOUTH TWICE DAILY BETWEEN MEALS AS NEEDED   NIFEdipine 60 MG 24 hr tablet Commonly known as: ADALAT CC TAKE 1 TABLET BY MOUTH DAILY   oxyCODONE-acetaminophen 5-325 MG tablet Commonly known as: Percocet Take 1 tablet by mouth every 6 (six) hours as needed.   ranitidine 150 MG tablet Commonly known as: ZANTAC TAKE 1 TABLET BY MOUTH TWICE DAILY          Review of Systems      She is complaining about hot flashes and sweating spells that have been persisting Has not had any menstrual cycle for 2 months and periodically was having oligomenorrhea also   She has difficulty sleeping because of the symptoms She was given a prescription for HRT on the last visit but she did not start it for fear of side effects She said she read on the package insert for the patches that that would cause cancer and she did not start this        Examination:    There were no vitals taken for this visit.     Assessment:  HYPOTHYROIDISM, 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 She had been requiring variable doses previously However has not been seen in follow-up for over a year now  Recent labs indicate normal thyroid level She does have unrelated medical problems and symptoms cannot be assessed Her weight is altered by her edema    MENOPAUSAL symptoms: She is now not having significant hot flashes and sweating episodes and will not pursue any treatment  Generalized edema, weakness and muscle spasms: Etiology unclear She will need to be  evaluated by her PCP  PLAN:   No change in Synthroid  Follow-up in 6 months  Valissa Lyvers 08/27/2018, 8:09 AM     Note: This office note was prepared with Dragon voice recognition system technology. Any transcriptional errors that result from this process are unintentional.

## 2018-08-29 DIAGNOSIS — I10 Essential (primary) hypertension: Secondary | ICD-10-CM | POA: Diagnosis not present

## 2018-08-29 DIAGNOSIS — R252 Cramp and spasm: Secondary | ICD-10-CM | POA: Diagnosis not present

## 2018-09-02 ENCOUNTER — Encounter: Payer: Self-pay | Admitting: Physician Assistant

## 2018-09-02 ENCOUNTER — Ambulatory Visit (INDEPENDENT_AMBULATORY_CARE_PROVIDER_SITE_OTHER): Payer: Medicare Other | Admitting: Orthopaedic Surgery

## 2018-09-02 ENCOUNTER — Ambulatory Visit: Payer: Self-pay

## 2018-09-02 ENCOUNTER — Telehealth: Payer: Self-pay

## 2018-09-02 DIAGNOSIS — M62838 Other muscle spasm: Secondary | ICD-10-CM | POA: Diagnosis not present

## 2018-09-02 DIAGNOSIS — M25569 Pain in unspecified knee: Secondary | ICD-10-CM

## 2018-09-02 MED ORDER — PREDNISONE 5 MG (21) PO TBPK: ORAL_TABLET | ORAL | 0 refills | Status: DC

## 2018-09-02 NOTE — Telephone Encounter (Signed)
Order put in for bilateral lower extremity dopplers Does not have to be done today, just in next few days per Mendel Ryder

## 2018-09-02 NOTE — Progress Notes (Signed)
Office Visit Note   Patient: Tonya Greene           Date of Birth: 09-Oct-1970           MRN: [id number] Visit Date: 09/02/2018              Requested by: Hoyt Koch, MD [place],  [place] [id number] PCP: Hoyt Koch, MD   Assessment & Plan: Visit Diagnoses:  1. Muscle spasm     Plan: Impression is questionable neurologic condition doubt orthopedic related.  We will make an urgent referral to a neurologist.  we will also order venous Doppler ultrasounds of both lower extremities.  I will start her on a Sterapred taper in the meantime.  If her condition worsens, she has been instructed to go back to the emergency department.  She will follow-up with [place] as needed.  Follow-Up Instructions: Return if symptoms worsen or fail to improve.   Orders:  No orders of the defined types were placed in this encounter.  No orders of the defined types were placed in this encounter.     Procedures: No procedures performed   Clinical Data: No additional findings.   Subjective: No chief complaint on file.   HPI patient is a pleasant 48 year old female who comes in today in a wheelchair.  She is complaining of muscle spasms throughout her entire body which have been ongoing for the past 3 months.  She states that she is unable to walk due to the severe spasms.  She is unable to control these.  She notes numbness and tingling throughout her entire body as well.  She has been on chronic Percocet tens as well as Soma from her PCP.  She was seen in the ED on 08/04/2018 for this.  CBC, C met and thyroid panels obtained which were all within normal limits.  She notes that she has never seen a neurologist.  She comes in today for further evaluation and treatment recommendation.  During the entire encounter she had multiple spasms where her legs and arms withdrawal and her eyes appear to somewhat roll in the back of her head.  No loss of consciousness.  The spasms she  has go from her feet all the way up her entire body.  She has these in her arms as well.  Any movement of her arms or legs seems to trigger these.  She also notes swelling and pain to both calves.  She was offered a right lower extremity ultrasound while in the ED but declined.  She denies any chest pain or shortness of breath.  No history of neurologic conditions.  Review of Systems as detailed in HPI.  All others reviewed and are negative.   Objective: Vital Signs: There were no vitals taken for this visit.  Physical Exam well-developed and well-nourished female.  She is in slight distress.  Ortho Exam it is very hard to examine her today as she has extreme diffuse tenderness throughout her entire body.  She states that any pressure to her extremities causes her to spasm.  She does have swelling to both lower extremities.  She has pain when trying to dorsiflex her ankles.  Specialty Comments:  No specialty comments available.  Imaging: No new imaging   PMFS History: Patient Active Problem List   Diagnosis Date Noted  . 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 GERD (gastroesophageal reflux disease) 07/24/2016  . Ganglion cyst of right foot 04/12/2015  . Graves disease  10/29/2013  . Benign essential HTN 10/29/2013  . Lumbar disc disease 10/29/2013  . Morbid obesity with BMI of 45.0-49.9, adult ([place]) 10/29/2013   Past Medical History:  Diagnosis Date  . Arthritis   . Depression   . Disc disease with myelopathy, lumbar   . Hypertension   . Muscle spasms of both lower extremities   . Thyroid cancer ([place])     Family History  Problem Relation Age of Onset  . Diabetes Mother   . Hypertension Mother   . Diabetes Father   . Hypertension Father   . Heart failure Father     Past Surgical History:  Procedure Laterality Date  . COLONOSCOPY     repair of fissures  . glands     under arms-removed   Social History   Occupational History  . Not on file  Tobacco Use  .  Smoking status: Current Every Day Smoker    Types: Cigarettes  . Smokeless tobacco: Never Used  Substance and Sexual Activity  . Alcohol use: Yes  . Drug use: Not on file  . Sexual activity: Yes    Birth control/protection: Condom

## 2018-09-03 NOTE — Telephone Encounter (Signed)
Pt is scheduled at [facility] on Fri Aug 31 at 11am, I tried calling pt left vm to return my call.

## 2018-09-04 NOTE — Telephone Encounter (Signed)
Tried calling pt again left vm to return my call.

## 2018-09-05 ENCOUNTER — Ambulatory Visit (HOSPITAL_COMMUNITY): Admission: RE | Admit: 2018-09-05 | Payer: Medicare Other | Source: Ambulatory Visit

## 2018-09-06 ENCOUNTER — Emergency Department (HOSPITAL_BASED_OUTPATIENT_CLINIC_OR_DEPARTMENT_OTHER)
Admit: 2018-09-06 | Discharge: 2018-09-06 | Disposition: A | Discharge status: Home / Self Care | Payer: Medicare Other | Attending: Emergency Medicine | Admitting: Emergency Medicine

## 2018-09-06 ENCOUNTER — Emergency Department (HOSPITAL_COMMUNITY): Payer: Medicare Other

## 2018-09-06 ENCOUNTER — Other Ambulatory Visit: Payer: Self-pay

## 2018-09-06 ENCOUNTER — Emergency Department (HOSPITAL_COMMUNITY)
Admission: EM | Admit: 2018-09-06 | Discharge: 2018-09-06 | Disposition: A | Discharge status: Home / Self Care | Payer: Medicare Other | Attending: Emergency Medicine | Admitting: Emergency Medicine

## 2018-09-06 ENCOUNTER — Encounter (HOSPITAL_COMMUNITY): Payer: Self-pay | Admitting: *Deleted

## 2018-09-06 DIAGNOSIS — R000 Tachycardia, unspecified: Secondary | ICD-10-CM | POA: Diagnosis not present

## 2018-09-06 DIAGNOSIS — R1084 Generalized abdominal pain: Secondary | ICD-10-CM | POA: Diagnosis not present

## 2018-09-06 DIAGNOSIS — R109 Unspecified abdominal pain: Secondary | ICD-10-CM | POA: Diagnosis not present

## 2018-09-06 DIAGNOSIS — R52 Pain, unspecified: Secondary | ICD-10-CM | POA: Diagnosis not present

## 2018-09-06 DIAGNOSIS — M7989 Other specified soft tissue disorders: Secondary | ICD-10-CM | POA: Diagnosis not present

## 2018-09-06 DIAGNOSIS — R279 Unspecified lack of coordination: Secondary | ICD-10-CM | POA: Diagnosis not present

## 2018-09-06 DIAGNOSIS — R600 Localized edema: Secondary | ICD-10-CM | POA: Diagnosis not present

## 2018-09-06 DIAGNOSIS — R58 Hemorrhage, not elsewhere classified: Secondary | ICD-10-CM | POA: Diagnosis not present

## 2018-09-06 DIAGNOSIS — E0500 Thyrotoxicosis with diffuse goiter without thyrotoxic crisis or storm: Secondary | ICD-10-CM | POA: Diagnosis not present

## 2018-09-06 DIAGNOSIS — R0602 Shortness of breath: Secondary | ICD-10-CM | POA: Diagnosis not present

## 2018-09-06 DIAGNOSIS — R609 Edema, unspecified: Secondary | ICD-10-CM

## 2018-09-06 DIAGNOSIS — R1010 Upper abdominal pain, unspecified: Secondary | ICD-10-CM | POA: Diagnosis not present

## 2018-09-06 DIAGNOSIS — F17210 Nicotine dependence, cigarettes, uncomplicated: Secondary | ICD-10-CM | POA: Diagnosis not present

## 2018-09-06 DIAGNOSIS — M5489 Other dorsalgia: Secondary | ICD-10-CM | POA: Diagnosis not present

## 2018-09-06 DIAGNOSIS — Z79899 Other long term (current) drug therapy: Secondary | ICD-10-CM | POA: Diagnosis not present

## 2018-09-06 DIAGNOSIS — I10 Essential (primary) hypertension: Secondary | ICD-10-CM | POA: Insufficient documentation

## 2018-09-06 DIAGNOSIS — R1013 Epigastric pain: Secondary | ICD-10-CM | POA: Insufficient documentation

## 2018-09-06 DIAGNOSIS — Z743 Need for continuous supervision: Secondary | ICD-10-CM | POA: Diagnosis not present

## 2018-09-06 LAB — COMPREHENSIVE METABOLIC PANEL
ALT: 25 U/L (ref 0–44)
AST: 27 U/L (ref 15–41)
Albumin: 4.7 g/dL (ref 3.5–5.0)
Alkaline Phosphatase: 77 U/L (ref 38–126)
Anion gap: 13 (ref 5–15)
BUN: 9 mg/dL (ref 6–20)
CO2: 24 mmol/L (ref 22–32)
Calcium: 9.8 mg/dL (ref 8.9–10.3)
Chloride: 104 mmol/L (ref 98–111)
Creatinine, Ser: 0.6 mg/dL (ref 0.44–1.00)
GFR calc Af Amer: >60 mL/min (ref >60)
GFR calc non Af Amer: >60 mL/min (ref >60)
Glucose, Bld: 100 mg/dL — ABNORMAL HIGH (ref 70–99)
Potassium: 4.1 mmol/L (ref 3.5–5.1)
Sodium: 141 mmol/L (ref 135–145)
Total Bilirubin: 0.4 mg/dL (ref 0.3–1.2)
Total Protein: 8.1 g/dL (ref 6.5–8.1)

## 2018-09-06 LAB — URINALYSIS, ROUTINE W REFLEX MICROSCOPIC
Bilirubin Urine: NEGATIVE
Glucose, UA: NEGATIVE mg/dL
Ketones, ur: NEGATIVE mg/dL
Nitrite: NEGATIVE
Protein, ur: 100 mg/dL — AB
Specific Gravity, Urine: >1.046 — ABNORMAL HIGH (ref 1.005–1.030)
WBC, UA: >50 WBC/hpf — ABNORMAL HIGH (ref 0–5)
pH: 6 (ref 5.0–8.0)

## 2018-09-06 LAB — TROPONIN I (HIGH SENSITIVITY)
Troponin I (High Sensitivity): 4 ng/L (ref <18)
Troponin I (High Sensitivity): 4 ng/L (ref <18)

## 2018-09-06 LAB — CBC
HCT: 44.9 % (ref 36.0–46.0)
Hemoglobin: 14.8 g/dL (ref 12.0–15.0)
MCH: 32.5 pg (ref 26.0–34.0)
MCHC: 33 g/dL (ref 30.0–36.0)
MCV: 98.5 fL (ref 80.0–100.0)
Platelets: 487 10*3/uL — ABNORMAL HIGH (ref 150–400)
RBC: 4.56 MIL/uL (ref 3.87–5.11)
RDW: 12.9 % (ref 11.5–15.5)
WBC: 11.7 10*3/uL — ABNORMAL HIGH (ref 4.0–10.5)
nRBC: 0 % (ref 0.0–0.2)

## 2018-09-06 LAB — CBG MONITORING, ED: Glucose-Capillary: 90 mg/dL (ref 70–99)

## 2018-09-06 LAB — I-STAT BETA HCG BLOOD, ED (MC, WL, AP ONLY): I-stat hCG, quantitative: <5 m[IU]/mL (ref <5)

## 2018-09-06 LAB — CK: Total CK: 287 U/L — ABNORMAL HIGH (ref 38–234)

## 2018-09-06 LAB — LIPASE, BLOOD: Lipase: 73 U/L — ABNORMAL HIGH (ref 11–51)

## 2018-09-06 MED ORDER — SODIUM CHLORIDE 0.9 % IV SOLN
INTRAVENOUS | Status: DC
  Administered 2018-09-06: 13:00:00 via INTRAVENOUS

## 2018-09-06 MED ORDER — CEPHALEXIN 500 MG PO CAPS: 500.0000 mg | ORAL_CAPSULE | Freq: Two times a day (BID) | ORAL | 0 refills | Status: DC

## 2018-09-06 MED ORDER — SODIUM CHLORIDE 0.9 % IV BOLUS
500.0000 mL | Freq: Once | INTRAVENOUS | Status: AC
  Administered 2018-09-06: 500 mL via INTRAVENOUS

## 2018-09-06 MED ORDER — IOHEXOL 300 MG/ML  SOLN
100.0000 mL | Freq: Once | INTRAMUSCULAR | Status: AC | PRN
  Administered 2018-09-06: 100 mL via INTRAVENOUS

## 2018-09-06 MED ORDER — ONDANSETRON HCL 4 MG/2ML IJ SOLN
4.0000 mg | Freq: Once | INTRAMUSCULAR | Status: AC
  Administered 2018-09-06: 13:00:00 4 mg via INTRAVENOUS
  Filled 2018-09-06: qty 2

## 2018-09-06 MED ORDER — HYDROMORPHONE HCL 1 MG/ML IJ SOLN
1.0000 mg | Freq: Once | INTRAMUSCULAR | Status: AC
  Administered 2018-09-06: 1 mg via INTRAVENOUS
  Filled 2018-09-06: qty 1

## 2018-09-06 MED ORDER — SODIUM CHLORIDE 0.9% FLUSH
3.0000 mL | Freq: Once | INTRAVENOUS | Status: AC
  Administered 2018-09-06: 3 mL via INTRAVENOUS

## 2018-09-06 MED ORDER — RANITIDINE HCL 150 MG PO CAPS: 150.0000 mg | ORAL_CAPSULE | Freq: Every day | ORAL | 0 refills | Status: DC

## 2018-09-06 MED ORDER — HYDROMORPHONE HCL 1 MG/ML IJ SOLN
0.5000 mg | Freq: Once | INTRAMUSCULAR | Status: AC
  Administered 2018-09-06: 0.5 mg via INTRAVENOUS
  Filled 2018-09-06: qty 1

## 2018-09-06 MED ORDER — SODIUM CHLORIDE (PF) 0.9 % IJ SOLN
INTRAMUSCULAR | Status: AC
  Filled 2018-09-06: qty 50

## 2018-09-06 NOTE — ED Provider Notes (Signed)
Patient presented with abdominal pain going up to her chest.  This is been going on for a while and she has been seen by her PCP for this as well.  She had an extensive evaluation done by Dr. Tomi Bamberger.  CT scan does not show any acute abnormality.  Her labs are grossly unremarkable.  Her lipase is mildly elevated but she does not have other findings that would be more consistent with pancreatitis.  She did have some blood in her urine and suggestions of infection.  There is no evidence of a kidney stone or mass on CT.  She will be started on Keflex.  Her urine was sent for culture.  She was started on some Zantac for her stomach pain.  She is had 2- troponins.  No other symptoms that are more suggestive of ACS.  She was discharged home in good condition.  She was encouraged to have a follow-up appointment with her PCP.  Return precautions were given.   Tonya Johns, MD 09/06/18 [phone]

## 2018-09-06 NOTE — ED Notes (Signed)
Bil leg and feet swelling which is not new but states she can hardly walk now due to swelling. She holds up epigastric area stating it is chest pain then verifies with her hand position epigastric pain.

## 2018-09-06 NOTE — ED Triage Notes (Signed)
BIB EMS abd pain on and off for 3 months, has been seen multiple times, unable to determine the cause, hurting today, pain in lower abd radiating up and in lower back. Pain in waves. Pt wanted to be checked again today due to pain. #20 Rt AC with 200 mcg Fentanyl by EMS, 160/114-73- 100% 4 L [place]. Pt was anxious upon arrival.

## 2018-09-06 NOTE — ED Notes (Signed)
PTAR CALLED  °

## 2018-09-06 NOTE — ED Notes (Signed)
Pt returned from CT with multiple complaints of too hot, needs fan or AC, ? anxiety related

## 2018-09-06 NOTE — ED Provider Notes (Signed)
[place] DEPT Provider Note   CSN: [id number] Arrival date & time: 09/06/18  1111    History   Chief Complaint Chief Complaint  Patient presents with   Abdominal Pain    HPI LIVANA YERIAN is a 48 y.o. female.     HPI Patient presents to the emergency room for evaluation of diffuse pain throughout her body.  Patient states it has been going on and off for several months.  Patient states she has seen several doctors and no one has been able to tell her the cause.  She feels like she gets spasms in her extremities but also started having pain in her abdomen that feels like she is being pulled in.  Pain then radiates up into her chest.  Patient states she was in the emergency room for this.  Medical records indicate she was in the ED at the end of June with complaints of muscle pain in her leg.  A Doppler study was recommended the patient opted to leave and follow-up with her doctor.  Adequate records indicate the patient's her endocrinologist on July 22 and also saw an orthopedic doctor on July 28.  Patient had Doppler studies of her lower extremities ordered as an outpatient but they have not been done yet.  Patient states she is having pain now in her upper abdomen.  It goes up into her chest.  She feels like she is bloated and swollen.  She denies any fevers.  No vomiting or diarrhea.  She does feel like it is difficult to urinate.  Patient states the symptoms are getting worse and something needs to be done for it. 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      Home Medications    Prior to Admission medications   Medication Sig Start Date End Date Taking? Authorizing Provider  albuterol (PROVENTIL HFA;VENTOLIN HFA) 108 (90 Base) MCG/ACT inhaler Inhale 2 puffs into the lungs every 6 (six) hours as needed for wheezing or shortness of breath. 08/16/15  Yes Hoyt Koch, MD  cetirizine (ZYRTEC) 10 MG tablet Take 1 tablet (10 mg total) by mouth daily. Need annual visit for further refills 06/19/17  Yes Hoyt Koch, MD  levothyroxine (SYNTHROID, LEVOTHROID) 137 MCG tablet TAKE 1 TABLET BY MOUTH DAILY BEFORE BREAKFAST Patient taking differently: Take 137 mcg by mouth daily before breakfast.  01/03/18  Yes [name], MD  lisinopril (PRINIVIL,ZESTRIL) 10 MG tablet TAKE 1 TABLET BY MOUTH DAILY 03/28/17  Yes Hoyt Koch, MD  naproxen (NAPROSYN) 500 MG tablet TAKE 1 TABLET BY MOUTH TWICE DAILY BETWEEN MEALS AS NEEDED Patient taking differently: Take 500 mg by mouth 2 (two) times daily with  a meal.  03/28/17  Yes Hoyt Koch, MD  NIFEdipine (PROCARDIA-XL/ADALAT CC) 60 MG 24 hr tablet TAKE 1 TABLET BY MOUTH DAILY 03/28/17  Yes Hoyt Koch, MD  oxyCODONE-acetaminophen (PERCOCET) 5-325 MG tablet Take 1 tablet by mouth every 6 (six) hours as needed. 08/04/18  Yes Milton Ferguson, MD  predniSONE (STERAPRED UNI-PAK 21 TAB) 5 MG (21) TBPK tablet Take dosepak as directed. 09/02/18  Yes Aundra Dubin, PA-C  cyclobenzaprine (FLEXERIL) 5 MG tablet Take 1 tablet (5 mg total) by mouth 3 (three) times daily as needed for muscle spasms. Patient not taking: Reported on 09/06/2018 07/24/16   Nche, Charlene Brooke, NP  estradiol-norethindrone  [facility]) 0.05-0.14 MG/DAY Place 1 patch onto the skin 2 (two) times a week. Patient not taking: Reported on 09/06/2018 03/07/17   [name], MD  ranitidine (ZANTAC) 150 MG tablet TAKE 1 TABLET BY MOUTH TWICE DAILY Patient not taking: Reported on 09/06/2018 07/24/16   Nche, Charlene Brooke, NP    Family History Family History  Problem Relation Age of Onset   Diabetes Mother    Hypertension Mother    Diabetes Father    Hypertension Father    Heart failure Father     Social History Social History   Tobacco Use   Smoking status: Current Every Day Smoker    Types: Cigarettes   Smokeless tobacco: Never Used  Substance Use Topics   Alcohol use: Yes   Drug use: Not on file     Allergies   Patient has no known allergies.   Review of Systems Review of Systems  All other systems reviewed and are negative.    Physical Exam Updated Vital Signs BP (!) 148/109 (BP Location: Left Arm)    Pulse 73    Resp 19    Ht 1.626 m (5\' 4" )    Wt (!) 142 kg    SpO2 92%    BMI 53.74 kg/m   Physical Exam Vitals signs and nursing note reviewed.  Constitutional:      General: She is not in acute distress.    Appearance: She is ill-appearing.     Comments: Morbidly obese  HENT:     Head: Normocephalic and atraumatic.     Right Ear: External ear normal.     Left Ear: External ear normal.  Eyes:     General: No scleral icterus.       Right eye: No discharge.        Left eye: No discharge.     Conjunctiva/sclera: Conjunctivae normal.  Neck:     Musculoskeletal: Neck supple.     Trachea: No tracheal deviation.  Cardiovascular:     Rate and Rhythm: Normal rate and regular rhythm.  Pulmonary:     Effort: Pulmonary effort is normal. No respiratory distress.     Breath sounds: Normal breath sounds. No stridor. No wheezing or rales.  Abdominal:     General: Bowel sounds are normal. There is no distension.     Palpations: Abdomen is soft.     Tenderness: There is abdominal  tenderness in the epigastric area. There is no guarding or rebound.  Musculoskeletal:        General: No tenderness.     Comments: Some erythema noted right upper thigh, muscle compartments are soft, no spasms noted  Skin:    General: Skin is warm and dry.     Findings: No rash.  Neurological:     Mental Status: She is alert.  Cranial Nerves: No cranial nerve deficit (no facial droop, extraocular movements intact, no slurred speech).     Sensory: No sensory deficit.     Motor: No abnormal muscle tone or seizure activity.     Coordination: Coordination normal.      ED Treatments / Results  Labs (all labs ordered are listed, but only abnormal results are displayed) Labs Reviewed  LIPASE, BLOOD - Abnormal; Notable for the following components:      Result Value   Lipase 73 (*)    All other components within normal limits  COMPREHENSIVE METABOLIC PANEL - Abnormal; Notable for the following components:   Glucose, Bld 100 (*)    All other components within normal limits  CBC - Abnormal; Notable for the following components:   WBC 11.7 (*)    Platelets 487 (*)    All other components within normal limits  CK - Abnormal; Notable for the following components:   Total CK 287 (*)    All other components within normal limits  URINALYSIS, ROUTINE W REFLEX MICROSCOPIC  I-STAT BETA HCG BLOOD, ED ([facility], [facility], [facility] ONLY)  CBG MONITORING, ED  TROPONIN I (HIGH SENSITIVITY)  TROPONIN I (HIGH SENSITIVITY)    EKG EKG Interpretation  Date/Time:  Saturday September 06 2018 11:30:27 EDT Ventricular Rate:  78 PR Interval:    QRS Duration: 85 QT Interval:  366 QTC Calculation: 417 R Axis:   48 Text Interpretation:  Sinus rhythm Probable left atrial enlargement Probable anterolateral infarct, old Baseline wander in lead(s) II III aVR aVF V3 No significant change since last tracing Confirmed by Dorie Rank [phone]) on 09/06/2018 11:45:36 AM   Radiology Ct Abdomen Pelvis W Contrast  Result Date:  09/06/2018 CLINICAL DATA:  Abdominal pain EXAM: CT ABDOMEN AND PELVIS WITH CONTRAST TECHNIQUE: Multidetector CT imaging of the abdomen and pelvis was performed using the standard protocol following bolus administration of intravenous contrast. CONTRAST:  140mL OMNIPAQUE IOHEXOL 300 MG/ML  SOLN COMPARISON:  None. FINDINGS: Lower chest: Lung bases are clear. Hepatobiliary: No focal liver lesions are appreciable. Gallbladder wall is not appreciably thickened. There is no biliary duct dilatation. Pancreas: There is no pancreatic mass or inflammatory focus. Spleen: No splenic lesions are evident. Adrenals/Urinary Tract: Adrenals bilaterally appear unremarkable. Kidneys bilaterally show no evident mass or hydronephrosis on either side. There is no evident renal or ureteral calculus on either side. Urinary bladder is midline with wall thickness within normal limits. Stomach/Bowel: There is no appreciable bowel wall or mesenteric thickening. No evident bowel obstruction. Terminal ileum appears normal. No evident free air or portal venous air. Vascular/Lymphatic: There is aortic atherosclerosis. No aneurysm evident. There is no adenopathy in the abdomen or pelvis. Reproductive: Uterus is anteverted.  No pelvic mass evident. Other: The appendix appears unremarkable. No abscess or ascites is demonstrable in the abdomen or pelvis. There are scattered foci of calcification in the abdominal and pelvic wall regions of uncertain etiology. Musculoskeletal: There is advanced arthropathy at L4-5 with vacuum phenomenon. There is 5 mm of anterolisthesis of L4 on L5. No other spondylolisthesis evident. There is severe spinal stenosis at L4-5 due to the spondylolisthesis as well as bony overgrowth. No blastic or lytic bone lesions evident. No intramuscular lesions are evident. IMPRESSION: 1. Advanced arthropathy at L4-5 with spondylolisthesis at this site. Question prior discitis in this area. 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 2. No demonstrable bowel obstruction. No abscess in the abdomen or pelvis. Appendix appears normal. 3.  No evident renal or ureteral calculus. No hydronephrosis. Urinary bladder wall thickness normal. 4. Scattered small foci of calcification in abdominal and pelvic wall regions. Question posttraumatic etiology or residua from prior infection with abdominal/pelvic wall calcification. Electronically Signed   By: Lowella Grip III M.D.   On: 09/06/2018 15:03   Vas [place] Lower Extremity Venous (dvt) (only [facility] & [facility] 7a-7p)  Result Date: 09/06/2018  Lower Venous Study Indications: Swelling.  Risk Factors: None identified. Limitations: Body habitus, poor ultrasound/tissue interface and patient pain tolerance, patient immobility, patient positioning. Comparison Study: No prior studies. Performing Technologist: Oliver Hum RVT  Examination Guidelines: A complete evaluation includes B-mode imaging, spectral Doppler, color Doppler, and power Doppler as needed of all accessible portions of each vessel. Bilateral testing is considered an integral part of a complete examination. Limited examinations for reoccurring indications may be performed as noted.  +---------+---------------+---------+-----------+----------+--------------+  RIGHT     Compressibility Phasicity Spontaneity Properties Summary         +---------+---------------+---------+-----------+----------+--------------+  CFV       Full            Yes       Yes                                    +---------+---------------+---------+-----------+----------+--------------+  SFJ       Full                                                             +---------+---------------+---------+-----------+----------+--------------+  FV Prox   Full                                                             +---------+---------------+---------+-----------+----------+--------------+  FV Mid    Full                                                              +---------+---------------+---------+-----------+----------+--------------+  FV Distal                 Yes       Yes                                    +---------+---------------+---------+-----------+----------+--------------+  PFV       Full                                                             +---------+---------------+---------+-----------+----------+--------------+  POP       Full            Yes  Yes                                    +---------+---------------+---------+-----------+----------+--------------+  PTV       Full                                                             +---------+---------------+---------+-----------+----------+--------------+  PERO                                                       Not visualized  +---------+---------------+---------+-----------+----------+--------------+   +---------+---------------+---------+-----------+----------+--------------+  LEFT      Compressibility Phasicity Spontaneity Properties Summary         +---------+---------------+---------+-----------+----------+--------------+  CFV       Full            Yes       Yes                                    +---------+---------------+---------+-----------+----------+--------------+  SFJ       Full                                                             +---------+---------------+---------+-----------+----------+--------------+  FV Prox   Full                                                             +---------+---------------+---------+-----------+----------+--------------+  FV Mid                    Yes       Yes                                    +---------+---------------+---------+-----------+----------+--------------+  FV Distal                 Yes       Yes                                    +---------+---------------+---------+-----------+----------+--------------+  PFV       Full                                                              +---------+---------------+---------+-----------+----------+--------------+  POP  Full            Yes       Yes                                    +---------+---------------+---------+-----------+----------+--------------+  PTV       Full                                                             +---------+---------------+---------+-----------+----------+--------------+  PERO                                                       Not visualized  +---------+---------------+---------+-----------+----------+--------------+     Summary: Right: Portions of this examination were limited- see technologist comments above. There is no evidence of deep vein thrombosis in the lower extremity. Left: No evidence of deep vein thrombosis in the lower extremity. Portions of this examination were limited- see technologist comments above.  *See table(s) above for measurements and observations.    Preliminary     Procedures Procedures (including critical care time)  Medications Ordered in ED Medications  sodium chloride 0.9 % bolus 500 mL (0 mLs Intravenous Stopped 09/06/18 1512)    And  0.9 %  sodium chloride infusion ( Intravenous New Bag/Given 09/06/18 1239)  sodium chloride (PF) 0.9 % injection (has no administration in time range)  sodium chloride flush (NS) 0.9 % injection 3 mL (3 mLs Intravenous Given 09/06/18 1138)  HYDROmorphone (DILAUDID) injection 0.5 mg (0.5 mg Intravenous Given 09/06/18 1241)  ondansetron (ZOFRAN) injection 4 mg (4 mg Intravenous Given 09/06/18 1241)  iohexol (OMNIPAQUE) 300 MG/ML solution 100 mL (100 mLs Intravenous Contrast Given 09/06/18 1428)  HYDROmorphone (DILAUDID) injection 1 mg (1 mg Intravenous Given 09/06/18 1518)     Initial Impression / Assessment and Plan / ED Course  I have reviewed the triage vital signs and the nursing notes.  Pertinent labs & imaging results that were available during my care of the patient were reviewed by me and considered in my medical decision making  (see chart for details).  Clinical Course as of Sep 06 1547  Sat Sep 06, 2018  1247 Labs show slightly elevated lipase and elevated white blood cell count.  Considering her significant tenderness on exam we will proceed with CT scan   [JK]  1452 Doppler studies negative for DVT   [JK]  1452 Lipase slightly elevated.   Unclear if she is having a case of acute pancreatitis.  Will correlate with the CT scan.  Slight elevation of CK but I doubt this is acutely clinically significant.  This could be related to her chronic muscle cramps.   [JK]  1545 CT scan without acute abnormalities.  Doubt acute pancreatitis   [JK]    Clinical Course User Index [JK] Dorie Rank, MD     Patient presented with various complaints including leg swelling, abdominal pain chest discomfort.  She has had an extensive work-up in the ED.  CT scan does not show any acute findings.  Doppler studies do not show evidence of  DVT.  Initial troponin is unremarkable.  Symptoms atypical for acute coronary syndrome but delta troponin is pending.  Patient's urinalysis pending.  Cosnsider dc home with diuretic for her leg swelling and antacids for possible PUD as the source of her upper abd discomfort.   Case discussed with Dr Tamera Punt who will follow up on those results and dispo  Final Clinical Impressions(s) / ED Diagnoses   Final diagnoses:  Abdominal pain, unspecified abdominal location  Peripheral edema      Dorie Rank, MD 09/06/18 1549

## 2018-09-06 NOTE — ED Notes (Signed)
Pt noted to be in extreme pain if someone is in room, otherwise distress has not been noted with crying and moving about.

## 2018-09-06 NOTE — Progress Notes (Signed)
Bilateral lower extremity venous duplex has been completed. Preliminary results can be found in CV Proc through chart review.   09/06/18 2:15 PM Carlos Levering RVT

## 2018-09-10 ENCOUNTER — Other Ambulatory Visit: Payer: Self-pay | Admitting: Physician Assistant

## 2018-09-10 DIAGNOSIS — M5416 Radiculopathy, lumbar region: Secondary | ICD-10-CM | POA: Diagnosis not present

## 2018-09-11 ENCOUNTER — Other Ambulatory Visit: Payer: Self-pay | Admitting: Neurosurgery

## 2018-09-11 DIAGNOSIS — M5416 Radiculopathy, lumbar region: Secondary | ICD-10-CM

## 2018-10-02 ENCOUNTER — Emergency Department (HOSPITAL_COMMUNITY): Payer: Medicare Other

## 2018-10-02 ENCOUNTER — Emergency Department (HOSPITAL_COMMUNITY)
Admission: EM | Admit: 2018-10-02 | Discharge: 2018-10-03 | Disposition: A | Discharge status: Home / Self Care | Payer: Medicare Other | Attending: Emergency Medicine | Admitting: Emergency Medicine

## 2018-10-02 ENCOUNTER — Encounter (HOSPITAL_COMMUNITY): Payer: Self-pay

## 2018-10-02 ENCOUNTER — Other Ambulatory Visit: Payer: Self-pay

## 2018-10-02 DIAGNOSIS — I10 Essential (primary) hypertension: Secondary | ICD-10-CM | POA: Diagnosis not present

## 2018-10-02 DIAGNOSIS — Z79899 Other long term (current) drug therapy: Secondary | ICD-10-CM | POA: Insufficient documentation

## 2018-10-02 DIAGNOSIS — Z85850 Personal history of malignant neoplasm of thyroid: Secondary | ICD-10-CM | POA: Diagnosis not present

## 2018-10-02 DIAGNOSIS — Z20828 Contact with and (suspected) exposure to other viral communicable diseases: Secondary | ICD-10-CM | POA: Diagnosis not present

## 2018-10-02 DIAGNOSIS — M62830 Muscle spasm of back: Secondary | ICD-10-CM | POA: Diagnosis not present

## 2018-10-02 DIAGNOSIS — R2243 Localized swelling, mass and lump, lower limb, bilateral: Secondary | ICD-10-CM | POA: Diagnosis present

## 2018-10-02 DIAGNOSIS — R609 Edema, unspecified: Secondary | ICD-10-CM | POA: Diagnosis not present

## 2018-10-02 DIAGNOSIS — R140 Abdominal distension (gaseous): Secondary | ICD-10-CM | POA: Diagnosis not present

## 2018-10-02 DIAGNOSIS — R0602 Shortness of breath: Secondary | ICD-10-CM | POA: Insufficient documentation

## 2018-10-02 DIAGNOSIS — R600 Localized edema: Secondary | ICD-10-CM | POA: Diagnosis not present

## 2018-10-02 DIAGNOSIS — F17210 Nicotine dependence, cigarettes, uncomplicated: Secondary | ICD-10-CM | POA: Diagnosis not present

## 2018-10-02 DIAGNOSIS — R52 Pain, unspecified: Secondary | ICD-10-CM | POA: Diagnosis not present

## 2018-10-02 NOTE — ED Triage Notes (Addendum)
Pt bib [facility] after c/o SOB. Per EMS pt has back spasms which induces SOB. Pt lives at home with home health. Per pt, PCP wanted her to be admitted to the hospital. VSS for EMS.

## 2018-10-02 NOTE — ED Provider Notes (Signed)
[name] EMERGENCY DEPARTMENT Provider Note   CSN: [id number] Arrival date & time: 10/02/18  2047     History   Chief Complaint Chief Complaint  Patient presents with  . Shortness of Breath    HPI Tonya Greene is a 48 y.o. female.     48 year old female with past medical history below including hypertension, thyroid cancer, back pain who presents with leg swelling and shortness of breath.  Patient states that she has had over 2 months of bilateral lower extremity edema that has been progressively worsening.  She is having difficulty walking due to the severity of the swelling and associated pain.  She also notes progressively worsening shortness of breath.  She sleeps sitting up in a recliner and reports dyspnea with exertion.  She has a chronic cough, no associated fevers and no sick contacts.  She has had some telemedicine visits with her PCP but has never been seen in the clinic for this.  She is not on any prescription diuretics, tried some over-the-counter diuretics which did not help.  She also reports severe back and abdominal spasms that she has had for a while.  She has a prescription for Soma but it does not help.  Spasms are intermittent and severe.  The history is provided by the patient.  Shortness of Breath   Past Medical History:  Diagnosis Date  . Arthritis   . Depression   . Disc disease with myelopathy, lumbar   . Hypertension   . Muscle spasms of both lower extremities   . Thyroid cancer [facility])     Patient Active Problem List   Diagnosis Date Noted  . 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 GERD (gastroesophageal reflux disease) 07/24/2016  . Ganglion cyst of right foot 04/12/2015  . Graves disease 10/29/2013  . Benign essential HTN 10/29/2013  . Lumbar disc disease 10/29/2013  . Morbid obesity with BMI of 45.0-49.9, adult ([place]) 10/29/2013    Past Surgical History:  Procedure Laterality Date  . COLONOSCOPY     repair of  fissures  . glands     under arms-removed     OB History   No obstetric history on file.      Home Medications    Prior to Admission medications   Medication Sig Start Date End Date Taking? Authorizing Provider  albuterol (PROVENTIL HFA;VENTOLIN HFA) 108 (90 Base) MCG/ACT inhaler Inhale 2 puffs into the lungs every 6 (six) hours as needed for wheezing or shortness of breath. 08/16/15  Yes Hoyt Koch, MD  carisoprodol (SOMA) 350 MG tablet Take 350 mg by mouth 4 (four) times daily as needed for muscle spasms.   Yes [provider]  cetirizine (ZYRTEC) 10 MG tablet Take 1 tablet (10 mg total) by mouth daily. Need annual visit for further refills 06/19/17  Yes Hoyt Koch, MD  famotidine (PEPCID) 20 MG tablet Take 20 mg by mouth 2 (two) times daily.   Yes [provider]  levothyroxine (SYNTHROID, LEVOTHROID) 137 MCG tablet TAKE 1 TABLET BY MOUTH DAILY BEFORE BREAKFAST Patient taking differently: Take 137 mcg by mouth daily before breakfast.  01/03/18  Yes [name], MD  lisinopril (PRINIVIL,ZESTRIL) 10 MG tablet TAKE 1 TABLET BY MOUTH DAILY Patient taking differently: Take 10 mg by mouth daily.  03/28/17  Yes Hoyt Koch, MD  NIFEdipine (PROCARDIA-XL/ADALAT CC) 60 MG 24 hr tablet TAKE 1 TABLET BY MOUTH DAILY Patient taking differently: Take 60 mg by mouth daily.  03/28/17  Yes Hoyt Koch, MD  oxyCODONE-acetaminophen (PERCOCET) 5-325 MG tablet Take 1 tablet by mouth every 6 (six) hours as needed. 08/04/18  Yes Milton Ferguson, MD  traMADol (ULTRAM) 50 MG tablet Take 50 mg by mouth every 6 (six) hours as needed for moderate pain.   Yes [provider]    Family History Family History  Problem Relation Age of Onset  . Diabetes Mother   . Hypertension Mother   . Diabetes Father   . Hypertension Father   . Heart failure Father     Social History Social History   Tobacco Use  . Smoking status: Current Every Day Smoker     Types: Cigarettes  . Smokeless tobacco: Never Used  Substance Use Topics  . Alcohol use: Yes  . Drug use: Not on file     Allergies   Patient has no known allergies.   Review of Systems Review of Systems  Respiratory: Positive for shortness of breath.    All other systems reviewed and are negative except that which was mentioned in HPI   Physical Exam Updated Vital Signs BP (!) 119/96   Pulse 87   Temp 97.9 F (36.6 C) (Oral)   Resp (!) 22   SpO2 100%   Physical Exam Vitals signs and nursing note reviewed.  Constitutional:      General: She is not in acute distress.    Appearance: She is well-developed.     Comments: In distress due to pain  HENT:     Head: Normocephalic and atraumatic.  Eyes:     Conjunctiva/sclera: Conjunctivae normal.  Neck:     Musculoskeletal: Neck supple.  Cardiovascular:     Rate and Rhythm: Normal rate and regular rhythm.     Heart sounds: Normal heart sounds. No murmur.  Pulmonary:     Effort: Pulmonary effort is normal.     Comments: Rhonchi b/l Abdominal:     General: Bowel sounds are normal. There is no distension.     Palpations: Abdomen is soft.     Tenderness: There is abdominal tenderness (generalized).  Musculoskeletal:     Right lower leg: She exhibits tenderness. Edema present.     Left lower leg: She exhibits tenderness. Edema present.     Comments: 3+ pitting edema BLE  Skin:    General: Skin is warm and dry.  Neurological:     Mental Status: She is alert and oriented to person, place, and time.     Comments: Fluent speech  Psychiatric:        Mood and Affect: Mood is anxious.        Judgment: Judgment normal.      ED Treatments / Results  Labs (all labs ordered are listed, but only abnormal results are displayed) Labs Reviewed  CBC WITH DIFFERENTIAL/PLATELET - Abnormal; Notable for the following components:      Result Value   RBC 3.75 (*)    MCV 100.3 (*)    All other components within normal limits   SARS CORONAVIRUS 2 (HOSPITAL ORDER, [facility])  BASIC METABOLIC PANEL  BRAIN NATRIURETIC PEPTIDE  LIPASE, BLOOD  HEPATIC FUNCTION PANEL  TSH    EKG EKG Interpretation  Date/Time:  Thursday October 02 2018 20:52:42 EDT Ventricular Rate:  89 PR Interval:    QRS Duration: 82 QT Interval:  352 QTC Calculation: 429 R Axis:   51 Text Interpretation:  Sinus rhythm No significant change since last tracing Confirmed by Dayton Sherr,  Apolonio Schneiders [phone]) on 10/02/2018 9:50:33 PM   Radiology Dg Chest 2 View  Result Date: 10/02/2018 CLINICAL DATA:  Leg swelling, short of breath EXAM: CHEST - 2 VIEW COMPARISON:  None. FINDINGS: No pleural effusion. No focal consolidation. The heart size is within normal limits. No pneumothorax. IMPRESSION: No active cardiopulmonary disease. Electronically Signed   By: Donavan Foil M.D.   On: 10/02/2018 22:43    Procedures Procedures (including critical care time)  Medications Ordered in ED Medications - No data to display   Initial Impression / Assessment and Plan / ED Course  I have reviewed the triage vital signs and the nursing notes.  Pertinent labs & imaging results that were available during my care of the patient were reviewed by me and considered in my medical decision making (see chart for details).       Patient did have significant bilateral lower extremity edema on exam.  Vital signs stable, normal O2 saturation on room air.  I reviewed her chart which shows work-up for her abdominal/muscle spasms at the beginning of this month.  Her work-up included CT of abdomen and pelvis and lab work, all of which was unremarkable.  She later had negative DVT ultrasounds.  Her spasms today sound similar.  Given progressive edema and shortness of breath, differential includes CHF.  I have ordered basic labs as well as a BNP.  Chest x-ray negative acute.  I have also ordered COVID-19 testing due to cough and shortness of breath.  I am  signing out to oncoming provider pending results.  Final Clinical Impressions(s) / ED Diagnoses   Final diagnoses:  None    ED Discharge Orders    None       Hervey Wedig, Wenda Overland, MD 10/02/18 [phone]

## 2018-10-03 DIAGNOSIS — Z743 Need for continuous supervision: Secondary | ICD-10-CM | POA: Diagnosis not present

## 2018-10-03 DIAGNOSIS — R609 Edema, unspecified: Secondary | ICD-10-CM | POA: Diagnosis not present

## 2018-10-03 DIAGNOSIS — R279 Unspecified lack of coordination: Secondary | ICD-10-CM | POA: Diagnosis not present

## 2018-10-03 LAB — CBC WITH DIFFERENTIAL/PLATELET
Abs Immature Granulocytes: 0.02 K/uL (ref 0.00–0.07)
Basophils Absolute: 0 K/uL (ref 0.0–0.1)
Basophils Relative: 0 %
Eosinophils Absolute: 0.2 K/uL (ref 0.0–0.5)
Eosinophils Relative: 4 %
HCT: 37.6 % (ref 36.0–46.0)
Hemoglobin: 12 g/dL (ref 12.0–15.0)
Immature Granulocytes: 0 %
Lymphocytes Relative: 48 %
Lymphs Abs: 2.2 K/uL (ref 0.7–4.0)
MCH: 32 pg (ref 26.0–34.0)
MCHC: 31.9 g/dL (ref 30.0–36.0)
MCV: 100.3 fL — ABNORMAL HIGH (ref 80.0–100.0)
Monocytes Absolute: 0.2 K/uL (ref 0.1–1.0)
Monocytes Relative: 5 %
Neutro Abs: 2 K/uL (ref 1.7–7.7)
Neutrophils Relative %: 43 %
Platelets: 40 K/uL — ABNORMAL LOW (ref 150–400)
RBC: 3.75 MIL/uL — ABNORMAL LOW (ref 3.87–5.11)
RDW: 12.9 % (ref 11.5–15.5)
WBC: 4.6 K/uL (ref 4.0–10.5)
nRBC: 0 % (ref 0.0–0.2)

## 2018-10-03 LAB — HEPATIC FUNCTION PANEL
ALT: 23 U/L (ref 0–44)
AST: 29 U/L (ref 15–41)
Albumin: 3.8 g/dL (ref 3.5–5.0)
Alkaline Phosphatase: 66 U/L (ref 38–126)
Bilirubin, Direct: <0.1 mg/dL (ref 0.0–0.2)
Total Bilirubin: 0.6 mg/dL (ref 0.3–1.2)
Total Protein: 6.6 g/dL (ref 6.5–8.1)

## 2018-10-03 LAB — LIPASE, BLOOD: Lipase: 56 U/L — ABNORMAL HIGH (ref 11–51)

## 2018-10-03 LAB — BASIC METABOLIC PANEL WITH GFR
Anion gap: 11 (ref 5–15)
BUN: 18 mg/dL (ref 6–20)
CO2: 25 mmol/L (ref 22–32)
Calcium: 9.6 mg/dL (ref 8.9–10.3)
Chloride: 100 mmol/L (ref 98–111)
Creatinine, Ser: 0.71 mg/dL (ref 0.44–1.00)
GFR calc Af Amer: >60 mL/min
GFR calc non Af Amer: >60 mL/min
Glucose, Bld: 95 mg/dL (ref 70–99)
Potassium: 4.8 mmol/L (ref 3.5–5.1)
Sodium: 136 mmol/L (ref 135–145)

## 2018-10-03 LAB — T4, FREE: Free T4: 0.88 ng/dL (ref 0.61–1.12)

## 2018-10-03 LAB — BRAIN NATRIURETIC PEPTIDE: B Natriuretic Peptide: 13.1 pg/mL (ref 0.0–100.0)

## 2018-10-03 LAB — SARS CORONAVIRUS 2 BY RT PCR (HOSPITAL ORDER, PERFORMED IN ~~LOC~~ HOSPITAL LAB): SARS Coronavirus 2: NEGATIVE

## 2018-10-03 LAB — TSH: TSH: 11.237 u[IU]/mL — ABNORMAL HIGH (ref 0.350–4.500)

## 2018-10-03 MED ORDER — FUROSEMIDE 10 MG/ML IJ SOLN
20.0000 mg | Freq: Once | INTRAMUSCULAR | Status: AC
  Administered 2018-10-03: 20 mg via INTRAVENOUS
  Filled 2018-10-03: qty 2

## 2018-10-03 MED ORDER — FUROSEMIDE 20 MG PO TABS: 20.0000 mg | ORAL_TABLET | Freq: Every day | ORAL | 0 refills | Status: DC

## 2018-10-03 NOTE — TOC Initial Note (Signed)
Transition of Care [facility]) - Initial/Assessment Note    Patient Details  Name: Tonya Greene MRN: [id number] Date of Birth: 1970-04-19  Transition of [facility]) CM/SW Contact:    Erenest Rasher, RN Phone Number:  [phone] 10/03/2018, 6:50 PM  Clinical Narrative:                 Spoke to pt and declines HH. States she has an aide that come daily for a few hours with [employer]. Has DME at home.   Expected Discharge Plan: [place] Barriers to Discharge: No Barriers Identified   Patient Goals and CMS Choice        Expected Discharge Plan and Services Expected Discharge Plan: [place]   Discharge Planning Services: CM Consult                               HH Arranged: Patient Refused [facility]          Prior Living Arrangements/Services   Lives with:: Significant Other Patient language and need for interpreter reviewed:: Yes Do you feel safe going back to the place where you live?: Yes      Need for Family Participation in Patient Care: No (Comment) Care giver support system in place?: No (comment) Current home services: DME, Sitter(Rolling Walker, wheelchair, bedside commode) Criminal Activity/Legal Involvement Pertinent to Current Situation/Hospitalization: No - Comment as needed  Activities of Daily Living      Permission Sought/Granted   Permission granted to share information with : Yes, Verbal Permission Granted  Share Information with NAME: Denton Ar     Permission granted to share info w Relationship: boyfriend  Permission granted to share info w Contact Information: [phone]  Emotional Assessment       Orientation: : Oriented to Self, Oriented to Place, Oriented to  Time, Oriented to Situation   Psych Involvement: No (comment)  Admission diagnosis:  sob Patient Active Problem List   Diagnosis Date Noted  . 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 GERD (gastroesophageal reflux disease)  07/24/2016  . Ganglion cyst of right foot 04/12/2015  . Graves disease 10/29/2013  . Benign essential HTN 10/29/2013  . Lumbar disc disease 10/29/2013  . Morbid obesity with BMI of 45.0-49.9, adult ([place]) 10/29/2013   PCP:  Hoyt Koch, MD Pharmacy:   [facility] DRUG STORE [place], [place] [address] [place] [address] Phone: [phone] Fax: [place], [place] - [place] [address] Phone: [phone] Fax: [phone]  [facility] DRUG STORE [place], [place] [address] Phone: [phone] Fax: [phone]  [place], [place] - [address] [place] [address] Phone: [phone] Fax: [phone]     Social Determinants of Health (SDOH) Interventions    Readmission Risk Interventions No flowsheet data found.

## 2018-10-03 NOTE — ED Notes (Signed)
Pt refusing discharge vital signs

## 2018-10-03 NOTE — ED Notes (Signed)
Attempted to check pulse ox while ambulating. Pt states she can not walk.

## 2018-10-03 NOTE — ED Notes (Signed)
Ptar called for pt 

## 2018-10-03 NOTE — ED Provider Notes (Signed)
I assumed care of this patient from Dr. Rex Kras.  Please see their note for further details of Hx, PE.  Briefly patient is a 48 y.o. female who presented with several months of BLE edema and dyspnea.  Dr. Rex Kras was working the patient up for possible heart failure.  Labs are pending.  Chest x-ray is negative.  Plan to DC patient home with Lasix.   Labs are reassuring without evidence of heart failure.  Given 20 mg IV Lasix.  Patient is diuresing well.  I reassessed patient.  She reports history of chronic back pain and sees neurosurgery.  On exam she does have significant bilateral lower extremity edema with some skin changes.  Does not appear to be cellulitic.  She is already been worked up for DVTs recently which was negative.  Given her back pain, and physical exam findings, I am also considering complex regional pain syndrome.  Will provide patient with information regarding this.  She will be DC'd home with prescription for Lasix.  Patient already has home health with bedside commode and walker.  Will consult care management to expand, to include physical therapy and Occupational Therapy.  The patient is safe for discharge with strict return precautions.   The patient appears reasonably screened and/or stabilized for discharge and I doubt any other medical condition or other [facility] requiring further screening, evaluation, or treatment in the ED at this time prior to discharge.  Disposition: Discharge  Condition: Good  I have discussed the results, Dx and Tx plan with the patient who expressed understanding and agree(s) with the plan. Discharge instructions discussed at great length. The patient was given strict return precautions who verbalized understanding of the instructions. No further questions at time of discharge.    ED Discharge Orders         Ordered    furosemide (LASIX) 20 MG tablet  Daily     10/03/18 0332            Follow Up: Hoyt Koch, MD [place] [address] [phone]  Schedule an appointment as soon as possible for a visit  in 2-3 weeks for renal function check and assess effectiveness of lasix for edema. You can also discuss possible Complex regional pain syndrome with them       Rohil Lesch, Grayce Sessions, MD 10/03/18 [phone]

## 2018-10-03 NOTE — ED Notes (Signed)
Patient verbalizes understanding of discharge instructions. Opportunity for questioning and answers were provided. Armband removed by staff, pt discharged from ED home via PTAR.  

## 2018-10-17 ENCOUNTER — Ambulatory Visit
Admission: RE | Admit: 2018-10-17 | Discharge: 2018-10-17 | Disposition: A | Discharge status: Home / Self Care | Payer: Medicare Other | Source: Ambulatory Visit | Attending: Neurosurgery | Admitting: Neurosurgery

## 2018-10-17 ENCOUNTER — Other Ambulatory Visit: Payer: Self-pay

## 2018-10-17 DIAGNOSIS — M5416 Radiculopathy, lumbar region: Secondary | ICD-10-CM | POA: Diagnosis not present

## 2018-10-17 DIAGNOSIS — M48061 Spinal stenosis, lumbar region without neurogenic claudication: Secondary | ICD-10-CM | POA: Diagnosis not present

## 2018-10-17 DIAGNOSIS — M545 Low back pain: Secondary | ICD-10-CM | POA: Diagnosis not present

## 2018-10-23 ENCOUNTER — Other Ambulatory Visit: Payer: Self-pay | Admitting: Neurosurgery

## 2018-10-23 DIAGNOSIS — M4316 Spondylolisthesis, lumbar region: Secondary | ICD-10-CM | POA: Diagnosis not present

## 2018-11-04 DIAGNOSIS — R600 Localized edema: Secondary | ICD-10-CM | POA: Diagnosis not present

## 2018-11-10 NOTE — Progress Notes (Signed)
[facility] DRUG STORE [place], [place] [address] Phone: [phone] Fax: [place], [place] [address] Phone: [phone] Fax: [phone]  [facility] DR[facility] STORE [place], [place] [address] Phone: [phone] Fax: [phone]  [place], [place] - [address] [place] [address] Phone: [phone] Fax: [phone]    Your procedure is scheduled on Thursday, October 8th  Report to [facility] Main Entrance "A" at 6:00 A.M., and check in at the Admitting office.  Call this number if you have problems the morning of surgery:  [phone]  Call [phone] if you have any questions prior to your surgery date Monday-Friday 8am-4pm   Remember:  Do not eat or drink after midnight the night before your surgery   Take these medicines the morning of surgery with A SIP OF WATER  cetirizine (ZYRTEC) famotidine (PEPCID) levothyroxine (SYNTHROID, LEVOTHROID)  NIFEdipine (PROCARDIA-XL/ADALAT CC) oxyCODONE-acetaminophen (PERCOCET)   If needed - albuterol (PROVENTIL HFA;VENTOLIN HFA)/inhaler: bring with you the day of surgery carisoprodol (SOMA),   7 days prior to surgery STOP taking any Aspirin (unless otherwise instructed by your surgeon), Aleve, Naproxen, Ibuprofen, Motrin, Advil, Goody's, BC's, all herbal medications, fish oil, and all vitamins.   The Morning of Surgery  Do not wear jewelry, make-up or nail polish.  Do not wear lotions, powders, or perfumes/colognes, or deodorant  Do not shave 48 hours prior to surgery.  Men may shave face and neck.  Do not bring valuables to the hospital.  [facility] is not responsible for any belongings or valuables.  If you are a smoker, DO NOT Smoke 24 hours prior to surgery IF you wear a CPAP at night please bring your mask, tubing, and machine the morning of surgery   Remember that you must have someone to transport you home after your surgery, and remain with you for 24 hours if you are discharged the same day.  Contacts, glasses, hearing aids, dentures or bridgework may not be worn into surgery.   Leave your suitcase in the car.  After surgery it may be brought to your room.  For patients admitted to the hospital, discharge time will be determined by your treatment team.  Patients discharged the day of surgery will not be allowed to drive home.   Special instructions:   [facility]- Preparing For Surgery  Before surgery, you can play an important role. Because skin is not sterile, your skin needs to be as free of germs as possible. You can reduce the number of germs on your skin by washing with CHG (chlorahexidine gluconate) Soap before surgery.  CHG is an antiseptic cleaner which kills germs and bonds with the skin to continue killing germs even after washing.    Oral Hygiene is also important to reduce your risk of infection.  Remember - BRUSH YOUR TEETH THE MORNING OF SURGERY WITH YOUR REGULAR TOOTHPASTE  Please do not use if you have an allergy to CHG or antibacterial soaps. If your skin becomes reddened/irritated stop using the CHG.  Do not shave (including legs and underarms) for at least 48 hours prior to first  CHG shower. It is OK to shave your face.  Please follow these instructions carefully.   1. Shower the NIGHT BEFORE SURGERY and the MORNING OF SURGERY with CHG Soap.   2. If you chose to wash your hair, wash your hair first as usual with your normal shampoo.  3. After you shampoo, rinse your hair and body thoroughly to remove the shampoo.  4. Use CHG as you would any other liquid soap. You can apply CHG directly to the  skin and wash gently with a scrungie or a clean washcloth.   5. Apply the CHG Soap to your body ONLY FROM THE NECK DOWN.  Do not use on open wounds or open sores. Avoid contact with your eyes, ears, mouth and genitals (private parts). Wash Face and genitals (private parts)  with your normal soap.   6. Wash thoroughly, paying special attention to the area where your surgery will be performed.  7. Thoroughly rinse your body with warm water from the neck down.  8. DO NOT shower/wash with your normal soap after using and rinsing off the CHG Soap.  9. Pat yourself dry with a CLEAN TOWEL.  10. Wear CLEAN PAJAMAS to bed the night before surgery, wear comfortable clothes the morning of surgery  11. Place CLEAN SHEETS on your bed the night of your first shower and DO NOT SLEEP WITH PETS.  Day of Surgery: Do not apply any deodorants/lotions. Please shower the morning of surgery with the CHG soap  Please wear clean clothes to the hospital/surgery center.   Remember to brush your teeth WITH YOUR REGULAR TOOTHPASTE.  Please read over the following fact sheets that you were given.

## 2018-11-11 ENCOUNTER — Other Ambulatory Visit: Payer: Self-pay

## 2018-11-11 ENCOUNTER — Encounter (HOSPITAL_COMMUNITY)
Admission: RE | Admit: 2018-11-11 | Discharge: 2018-11-11 | Disposition: A | Discharge status: Home / Self Care | Payer: Medicare Other | Source: Ambulatory Visit | Attending: Neurosurgery | Admitting: Neurosurgery

## 2018-11-11 ENCOUNTER — Encounter (HOSPITAL_COMMUNITY): Payer: Self-pay

## 2018-11-11 ENCOUNTER — Other Ambulatory Visit (HOSPITAL_COMMUNITY)
Admission: RE | Admit: 2018-11-11 | Discharge: 2018-11-11 | Disposition: A | Discharge status: Home / Self Care | Payer: Medicare Other | Source: Ambulatory Visit | Attending: Neurosurgery | Admitting: Neurosurgery

## 2018-11-11 DIAGNOSIS — Z01812 Encounter for preprocedural laboratory examination: Secondary | ICD-10-CM | POA: Insufficient documentation

## 2018-11-11 DIAGNOSIS — Z20828 Contact with and (suspected) exposure to other viral communicable diseases: Secondary | ICD-10-CM | POA: Insufficient documentation

## 2018-11-11 HISTORY — DX: Gastro-esophageal reflux disease without esophagitis: K21.9

## 2018-11-11 HISTORY — DX: Gastric ulcer, unspecified as acute or chronic, without hemorrhage or perforation: K25.9

## 2018-11-11 HISTORY — DX: Sleep apnea, unspecified: G47.30

## 2018-11-11 HISTORY — DX: Edema, unspecified: R60.9

## 2018-11-11 HISTORY — DX: Thyrotoxicosis with diffuse goiter without thyrotoxic crisis or storm: E05.00

## 2018-11-11 LAB — BASIC METABOLIC PANEL
Anion gap: 11 (ref 5–15)
BUN: 9 mg/dL (ref 6–20)
CO2: 25 mmol/L (ref 22–32)
Calcium: 9.6 mg/dL (ref 8.9–10.3)
Chloride: 103 mmol/L (ref 98–111)
Creatinine, Ser: 0.71 mg/dL (ref 0.44–1.00)
GFR calc Af Amer: >60 mL/min (ref >60)
GFR calc non Af Amer: >60 mL/min (ref >60)
Glucose, Bld: 96 mg/dL (ref 70–99)
Potassium: 4.5 mmol/L (ref 3.5–5.1)
Sodium: 139 mmol/L (ref 135–145)

## 2018-11-11 LAB — CBC WITH DIFFERENTIAL/PLATELET
Abs Immature Granulocytes: 0.05 10*3/uL (ref 0.00–0.07)
Basophils Absolute: 0 10*3/uL (ref 0.0–0.1)
Basophils Relative: 0 %
Eosinophils Absolute: 0.2 10*3/uL (ref 0.0–0.5)
Eosinophils Relative: 2 %
HCT: 37.9 % (ref 36.0–46.0)
Hemoglobin: 12.3 g/dL (ref 12.0–15.0)
Immature Granulocytes: 1 %
Lymphocytes Relative: 35 %
Lymphs Abs: 2.9 10*3/uL (ref 0.7–4.0)
MCH: 31.9 pg (ref 26.0–34.0)
MCHC: 32.5 g/dL (ref 30.0–36.0)
MCV: 98.4 fL (ref 80.0–100.0)
Monocytes Absolute: 0.6 10*3/uL (ref 0.1–1.0)
Monocytes Relative: 7 %
Neutro Abs: 4.4 10*3/uL (ref 1.7–7.7)
Neutrophils Relative %: 55 %
Platelets: 497 10*3/uL — ABNORMAL HIGH (ref 150–400)
RBC: 3.85 MIL/uL — ABNORMAL LOW (ref 3.87–5.11)
RDW: 13.7 % (ref 11.5–15.5)
WBC: 8.2 10*3/uL (ref 4.0–10.5)
nRBC: 0 % (ref 0.0–0.2)

## 2018-11-11 LAB — SURGICAL PCR SCREEN
MRSA, PCR: NEGATIVE
Staphylococcus aureus: NEGATIVE

## 2018-11-11 NOTE — Progress Notes (Signed)
PCP - Dr. Lin Landsman Cardiologist - denies  Chest x-ray - N/A EKG - 10/06/2018 Stress Test - denies ECHO - denies Cardiac Cath - denies  Sleep Study - YES, per patient in 2007 CPAP - YES, patient stated only wears it at night once someone is with her  Blood Thinner Instructions: N/A Aspirin Instructions: N/A  ERAS Protcol - No PRE-SURGERY Ensure - N/A   COVID TEST- 11/11/2018  Anesthesia review: No  Patient denies shortness of breath, fever, cough and chest pain at PAT appointment  Patient verbalized understanding of instructions that were given to them at the PAT appointment. Patient was also instructed that they will need to review over the PAT instructions again at home before surgery.

## 2018-11-12 LAB — NOVEL CORONAVIRUS, NAA (HOSP ORDER, SEND-OUT TO REF LAB; TAT 18-24 HRS): SARS-CoV-2, NAA: NOT DETECTED

## 2018-11-13 MED ORDER — DEXTROSE 5 % IV SOLN
3.0000 g | INTRAVENOUS | Status: AC
  Administered 2018-11-14: 08:00:00 3 g via INTRAVENOUS
  Filled 2018-11-13: qty 3

## 2018-11-14 ENCOUNTER — Inpatient Hospital Stay (HOSPITAL_COMMUNITY): Payer: Medicare Other | Admitting: Registered Nurse

## 2018-11-14 ENCOUNTER — Encounter (HOSPITAL_COMMUNITY)
Admission: RE | Disposition: A | Discharge status: Home Health | Payer: Self-pay | Source: Home / Self Care | Attending: Neurosurgery

## 2018-11-14 ENCOUNTER — Inpatient Hospital Stay (HOSPITAL_COMMUNITY): Payer: Medicare Other

## 2018-11-14 ENCOUNTER — Inpatient Hospital Stay (HOSPITAL_COMMUNITY)
Admission: RE | Admit: 2018-11-14 | Discharge: 2018-11-21 | DRG: 454 | Disposition: A | Discharge status: Home Health | Payer: Medicare Other | Attending: Neurosurgery | Admitting: Neurosurgery

## 2018-11-14 ENCOUNTER — Other Ambulatory Visit: Payer: Self-pay

## 2018-11-14 ENCOUNTER — Encounter (HOSPITAL_COMMUNITY): Payer: Self-pay | Admitting: *Deleted

## 2018-11-14 DIAGNOSIS — M5116 Intervertebral disc disorders with radiculopathy, lumbar region: Secondary | ICD-10-CM | POA: Diagnosis not present

## 2018-11-14 DIAGNOSIS — Z6841 Body Mass Index (BMI) 40.0 and over, adult: Secondary | ICD-10-CM | POA: Diagnosis not present

## 2018-11-14 DIAGNOSIS — R197 Diarrhea, unspecified: Secondary | ICD-10-CM | POA: Diagnosis present

## 2018-11-14 DIAGNOSIS — I10 Essential (primary) hypertension: Secondary | ICD-10-CM | POA: Diagnosis present

## 2018-11-14 DIAGNOSIS — Z833 Family history of diabetes mellitus: Secondary | ICD-10-CM

## 2018-11-14 DIAGNOSIS — Z85850 Personal history of malignant neoplasm of thyroid: Secondary | ICD-10-CM

## 2018-11-14 DIAGNOSIS — R0902 Hypoxemia: Secondary | ICD-10-CM | POA: Diagnosis not present

## 2018-11-14 DIAGNOSIS — M48061 Spinal stenosis, lumbar region without neurogenic claudication: Secondary | ICD-10-CM | POA: Diagnosis present

## 2018-11-14 DIAGNOSIS — Z20828 Contact with and (suspected) exposure to other viral communicable diseases: Secondary | ICD-10-CM | POA: Diagnosis present

## 2018-11-14 DIAGNOSIS — R601 Generalized edema: Secondary | ICD-10-CM | POA: Diagnosis present

## 2018-11-14 DIAGNOSIS — K219 Gastro-esophageal reflux disease without esophagitis: Secondary | ICD-10-CM | POA: Diagnosis present

## 2018-11-14 DIAGNOSIS — R208 Other disturbances of skin sensation: Secondary | ICD-10-CM | POA: Diagnosis present

## 2018-11-14 DIAGNOSIS — G4730 Sleep apnea, unspecified: Secondary | ICD-10-CM | POA: Diagnosis present

## 2018-11-14 DIAGNOSIS — Z981 Arthrodesis status: Secondary | ICD-10-CM | POA: Diagnosis not present

## 2018-11-14 DIAGNOSIS — M4310 Spondylolisthesis, site unspecified: Secondary | ICD-10-CM | POA: Diagnosis not present

## 2018-11-14 DIAGNOSIS — Z9119 Patient's noncompliance with other medical treatment and regimen: Secondary | ICD-10-CM

## 2018-11-14 DIAGNOSIS — I2510 Atherosclerotic heart disease of native coronary artery without angina pectoris: Secondary | ICD-10-CM | POA: Diagnosis present

## 2018-11-14 DIAGNOSIS — R9431 Abnormal electrocardiogram [ECG] [EKG]: Secondary | ICD-10-CM | POA: Diagnosis not present

## 2018-11-14 DIAGNOSIS — I959 Hypotension, unspecified: Secondary | ICD-10-CM | POA: Diagnosis not present

## 2018-11-14 DIAGNOSIS — Z8639 Personal history of other endocrine, nutritional and metabolic disease: Secondary | ICD-10-CM

## 2018-11-14 DIAGNOSIS — Z79899 Other long term (current) drug therapy: Secondary | ICD-10-CM

## 2018-11-14 DIAGNOSIS — Z95828 Presence of other vascular implants and grafts: Secondary | ICD-10-CM

## 2018-11-14 DIAGNOSIS — R600 Localized edema: Secondary | ICD-10-CM | POA: Diagnosis present

## 2018-11-14 DIAGNOSIS — M4696 Unspecified inflammatory spondylopathy, lumbar region: Secondary | ICD-10-CM | POA: Diagnosis not present

## 2018-11-14 DIAGNOSIS — Z8711 Personal history of peptic ulcer disease: Secondary | ICD-10-CM

## 2018-11-14 DIAGNOSIS — Z79890 Hormone replacement therapy (postmenopausal): Secondary | ICD-10-CM

## 2018-11-14 DIAGNOSIS — M48062 Spinal stenosis, lumbar region with neurogenic claudication: Secondary | ICD-10-CM | POA: Diagnosis not present

## 2018-11-14 DIAGNOSIS — R0602 Shortness of breath: Secondary | ICD-10-CM | POA: Diagnosis present

## 2018-11-14 DIAGNOSIS — Z419 Encounter for procedure for purposes other than remedying health state, unspecified: Secondary | ICD-10-CM

## 2018-11-14 DIAGNOSIS — M4316 Spondylolisthesis, lumbar region: Principal | ICD-10-CM | POA: Diagnosis present

## 2018-11-14 DIAGNOSIS — R252 Cramp and spasm: Secondary | ICD-10-CM | POA: Diagnosis not present

## 2018-11-14 DIAGNOSIS — M532X6 Spinal instabilities, lumbar region: Secondary | ICD-10-CM | POA: Diagnosis not present

## 2018-11-14 DIAGNOSIS — M4726 Other spondylosis with radiculopathy, lumbar region: Secondary | ICD-10-CM | POA: Diagnosis present

## 2018-11-14 DIAGNOSIS — E039 Hypothyroidism, unspecified: Secondary | ICD-10-CM | POA: Diagnosis present

## 2018-11-14 DIAGNOSIS — R0989 Other specified symptoms and signs involving the circulatory and respiratory systems: Secondary | ICD-10-CM | POA: Diagnosis not present

## 2018-11-14 DIAGNOSIS — F17210 Nicotine dependence, cigarettes, uncomplicated: Secondary | ICD-10-CM | POA: Diagnosis present

## 2018-11-14 DIAGNOSIS — Z8249 Family history of ischemic heart disease and other diseases of the circulatory system: Secondary | ICD-10-CM

## 2018-11-14 DIAGNOSIS — Z452 Encounter for adjustment and management of vascular access device: Secondary | ICD-10-CM | POA: Diagnosis not present

## 2018-11-14 LAB — POCT PREGNANCY, URINE: Preg Test, Ur: NEGATIVE

## 2018-11-14 SURGERY — POSTERIOR LUMBAR FUSION 2 LEVEL
Anesthesia: General | Site: Back

## 2018-11-14 MED ORDER — CHLORHEXIDINE GLUCONATE CLOTH 2 % EX PADS: 6.0000 | MEDICATED_PAD | Freq: Once | CUTANEOUS | Status: DC

## 2018-11-14 MED ORDER — ONDANSETRON HCL 4 MG/2ML IJ SOLN: 4.0000 mg | Freq: Four times a day (QID) | INTRAMUSCULAR | Status: DC | PRN

## 2018-11-14 MED ORDER — DEXAMETHASONE SODIUM PHOSPHATE 10 MG/ML IJ SOLN
INTRAMUSCULAR | Status: AC
  Filled 2018-11-14: qty 1

## 2018-11-14 MED ORDER — POLYETHYLENE GLYCOL 3350 17 G PO PACK
17.0000 g | PACK | Freq: Every day | ORAL | Status: DC | PRN
  Administered 2018-11-19: 17 g via ORAL
  Filled 2018-11-14: qty 1

## 2018-11-14 MED ORDER — SODIUM CHLORIDE 0.9 % IV SOLN
INTRAVENOUS | Status: DC | PRN
  Administered 2018-11-14: 09:00:00 500 mL

## 2018-11-14 MED ORDER — ONDANSETRON HCL 4 MG/2ML IJ SOLN
INTRAMUSCULAR | Status: DC | PRN
  Administered 2018-11-14: 4 mg via INTRAVENOUS

## 2018-11-14 MED ORDER — LEVOTHYROXINE SODIUM 25 MCG PO TABS
137.0000 ug | ORAL_TABLET | Freq: Every day | ORAL | Status: DC
  Administered 2018-11-15: 137 ug via ORAL
  Filled 2018-11-14: qty 1

## 2018-11-14 MED ORDER — HYDROCODONE-ACETAMINOPHEN 10-325 MG PO TABS
1.0000 | ORAL_TABLET | ORAL | Status: DC | PRN
  Administered 2018-11-14 – 2018-11-18 (×7): 1 via ORAL
  Filled 2018-11-14 (×7): qty 1

## 2018-11-14 MED ORDER — ACETAMINOPHEN 650 MG RE SUPP: 650.0000 mg | RECTAL | Status: DC | PRN

## 2018-11-14 MED ORDER — ROCURONIUM BROMIDE 10 MG/ML (PF) SYRINGE
PREFILLED_SYRINGE | INTRAVENOUS | Status: AC
  Filled 2018-11-14: qty 10

## 2018-11-14 MED ORDER — ACETAMINOPHEN 10 MG/ML IV SOLN
INTRAVENOUS | Status: AC
  Filled 2018-11-14: qty 100

## 2018-11-14 MED ORDER — VANCOMYCIN HCL POWD
Status: DC | PRN
  Administered 2018-11-14: 13:00:00 1000 mg via SURGICAL_CAVITY

## 2018-11-14 MED ORDER — THROMBIN 20000 UNITS EX SOLR
CUTANEOUS | Status: DC | PRN
  Administered 2018-11-14 (×2): 20 mL via TOPICAL

## 2018-11-14 MED ORDER — 0.9 % SODIUM CHLORIDE (POUR BTL) OPTIME
TOPICAL | Status: DC | PRN
  Administered 2018-11-14: 1000 mL

## 2018-11-14 MED ORDER — BUPIVACAINE HCL (PF) 0.25 % IJ SOLN
INTRAMUSCULAR | Status: DC | PRN
  Administered 2018-11-14: 30 mL

## 2018-11-14 MED ORDER — ROCURONIUM BROMIDE 50 MG/5ML IV SOSY
PREFILLED_SYRINGE | INTRAVENOUS | Status: DC | PRN
  Administered 2018-11-14: 20 mg via INTRAVENOUS
  Administered 2018-11-14: 50 mg via INTRAVENOUS
  Administered 2018-11-14: 30 mg via INTRAVENOUS
  Administered 2018-11-14 (×2): 50 mg via INTRAVENOUS

## 2018-11-14 MED ORDER — FENTANYL CITRATE (PF) 100 MCG/2ML IJ SOLN
INTRAMUSCULAR | Status: DC | PRN
  Administered 2018-11-14 (×3): 50 ug via INTRAVENOUS

## 2018-11-14 MED ORDER — SODIUM CHLORIDE 0.9 % IV SOLN
250.0000 mL | INTRAVENOUS | Status: DC
  Administered 2018-11-14: 250 mL via INTRAVENOUS

## 2018-11-14 MED ORDER — PROPOFOL 10 MG/ML IV BOLUS
INTRAVENOUS | Status: DC | PRN
  Administered 2018-11-14: 150 mg via INTRAVENOUS

## 2018-11-14 MED ORDER — ONDANSETRON HCL 4 MG/2ML IJ SOLN
INTRAMUSCULAR | Status: AC
  Filled 2018-11-14: qty 2

## 2018-11-14 MED ORDER — HYDROMORPHONE HCL 1 MG/ML IJ SOLN
INTRAMUSCULAR | Status: AC
  Filled 2018-11-14: qty 2

## 2018-11-14 MED ORDER — LACTATED RINGERS IV SOLN
INTRAVENOUS | Status: DC | PRN
  Administered 2018-11-14 (×2): via INTRAVENOUS

## 2018-11-14 MED ORDER — SODIUM CHLORIDE 0.9% FLUSH
3.0000 mL | Freq: Two times a day (BID) | INTRAVENOUS | Status: DC
  Administered 2018-11-14 – 2018-11-20 (×12): 3 mL via INTRAVENOUS

## 2018-11-14 MED ORDER — DEXAMETHASONE SODIUM PHOSPHATE 10 MG/ML IJ SOLN
10.0000 mg | Freq: Once | INTRAMUSCULAR | Status: AC
  Administered 2018-11-14: 10 mg via INTRAVENOUS

## 2018-11-14 MED ORDER — PROMETHAZINE HCL 25 MG/ML IJ SOLN: 6.2500 mg | INTRAMUSCULAR | Status: DC | PRN

## 2018-11-14 MED ORDER — VANCOMYCIN HCL 1000 MG IV SOLR
INTRAVENOUS | Status: AC
  Filled 2018-11-14: qty 1000

## 2018-11-14 MED ORDER — MIDAZOLAM HCL 5 MG/5ML IJ SOLN
INTRAMUSCULAR | Status: DC | PRN
  Administered 2018-11-14: 2 mg via INTRAVENOUS

## 2018-11-14 MED ORDER — EPHEDRINE 5 MG/ML INJ
INTRAVENOUS | Status: AC
  Filled 2018-11-14: qty 10

## 2018-11-14 MED ORDER — THROMBIN 5000 UNITS EX SOLR
CUTANEOUS | Status: AC
  Filled 2018-11-14: qty 5000

## 2018-11-14 MED ORDER — MENTHOL 3 MG MT LOZG: 1.0000 | LOZENGE | OROMUCOSAL | Status: DC | PRN

## 2018-11-14 MED ORDER — SUCCINYLCHOLINE CHLORIDE 200 MG/10ML IV SOSY
PREFILLED_SYRINGE | INTRAVENOUS | Status: AC
  Filled 2018-11-14: qty 10

## 2018-11-14 MED ORDER — EPHEDRINE SULFATE-NACL 50-0.9 MG/10ML-% IV SOSY
PREFILLED_SYRINGE | INTRAVENOUS | Status: DC | PRN
  Administered 2018-11-14: 10 mg via INTRAVENOUS

## 2018-11-14 MED ORDER — HYDROMORPHONE HCL 1 MG/ML IJ SOLN
0.2500 mg | INTRAMUSCULAR | Status: DC | PRN
  Administered 2018-11-14 (×2): 0.5 mg via INTRAVENOUS

## 2018-11-14 MED ORDER — ONDANSETRON HCL 4 MG PO TABS: 4.0000 mg | ORAL_TABLET | Freq: Four times a day (QID) | ORAL | Status: DC | PRN

## 2018-11-14 MED ORDER — LISINOPRIL 10 MG PO TABS
10.0000 mg | ORAL_TABLET | Freq: Every day | ORAL | Status: DC
  Administered 2018-11-15: 10 mg via ORAL
  Filled 2018-11-14: qty 1

## 2018-11-14 MED ORDER — DIAZEPAM 5 MG PO TABS
5.0000 mg | ORAL_TABLET | Freq: Four times a day (QID) | ORAL | Status: DC | PRN
  Administered 2018-11-14: 5 mg via ORAL
  Administered 2018-11-14 – 2018-11-21 (×16): 10 mg via ORAL
  Filled 2018-11-14 (×16): qty 2

## 2018-11-14 MED ORDER — THROMBIN 20000 UNITS EX SOLR
CUTANEOUS | Status: AC
  Filled 2018-11-14: qty 20000

## 2018-11-14 MED ORDER — PHENYLEPHRINE 40 MCG/ML (10ML) SYRINGE FOR IV PUSH (FOR BLOOD PRESSURE SUPPORT)
PREFILLED_SYRINGE | INTRAVENOUS | Status: AC
  Filled 2018-11-14: qty 10

## 2018-11-14 MED ORDER — LORATADINE 10 MG PO TABS
10.0000 mg | ORAL_TABLET | Freq: Every day | ORAL | Status: DC
  Administered 2018-11-15 – 2018-11-21 (×7): 10 mg via ORAL
  Filled 2018-11-14 (×7): qty 1

## 2018-11-14 MED ORDER — PROPOFOL 10 MG/ML IV BOLUS
INTRAVENOUS | Status: AC
  Filled 2018-11-14: qty 40

## 2018-11-14 MED ORDER — ACETAMINOPHEN 325 MG PO TABS
650.0000 mg | ORAL_TABLET | ORAL | Status: DC | PRN
  Administered 2018-11-21: 650 mg via ORAL
  Filled 2018-11-14: qty 2

## 2018-11-14 MED ORDER — OXYCODONE HCL 5 MG PO TABS
ORAL_TABLET | ORAL | Status: AC
  Administered 2018-11-14: 5 mg
  Filled 2018-11-14: qty 1

## 2018-11-14 MED ORDER — SODIUM CHLORIDE 0.9% FLUSH
3.0000 mL | INTRAVENOUS | Status: DC | PRN
  Administered 2018-11-19: 3 mL via INTRAVENOUS
  Filled 2018-11-14: qty 3

## 2018-11-14 MED ORDER — BISACODYL 10 MG RE SUPP: 10.0000 mg | Freq: Every day | RECTAL | Status: DC | PRN

## 2018-11-14 MED ORDER — FUROSEMIDE 20 MG PO TABS: 20.0000 mg | ORAL_TABLET | Freq: Every day | ORAL | Status: DC

## 2018-11-14 MED ORDER — FLEET ENEMA 7-19 GM/118ML RE ENEM: 1.0000 | ENEMA | Freq: Once | RECTAL | Status: DC | PRN

## 2018-11-14 MED ORDER — SODIUM CHLORIDE 0.9 % IV SOLN
INTRAVENOUS | Status: DC | PRN
  Administered 2018-11-14: 25 ug/min via INTRAVENOUS

## 2018-11-14 MED ORDER — SUGAMMADEX SODIUM 500 MG/5ML IV SOLN
INTRAVENOUS | Status: DC | PRN
  Administered 2018-11-14 (×2): 150 mg via INTRAVENOUS

## 2018-11-14 MED ORDER — SUCCINYLCHOLINE CHLORIDE 20 MG/ML IJ SOLN
INTRAMUSCULAR | Status: DC | PRN
  Administered 2018-11-14: 160 mg via INTRAVENOUS

## 2018-11-14 MED ORDER — OXYCODONE HCL 5 MG PO TABS
10.0000 mg | ORAL_TABLET | ORAL | Status: DC | PRN
  Administered 2018-11-14 – 2018-11-21 (×22): 10 mg via ORAL
  Filled 2018-11-14 (×22): qty 2

## 2018-11-14 MED ORDER — BUPIVACAINE HCL (PF) 0.25 % IJ SOLN
INTRAMUSCULAR | Status: AC
  Filled 2018-11-14: qty 30

## 2018-11-14 MED ORDER — PHENOL 1.4 % MT LIQD: 1.0000 | OROMUCOSAL | Status: DC | PRN

## 2018-11-14 MED ORDER — SUGAMMADEX SODIUM 500 MG/5ML IV SOLN
INTRAVENOUS | Status: AC
  Filled 2018-11-14: qty 5

## 2018-11-14 MED ORDER — CEFAZOLIN SODIUM-DEXTROSE 1-4 GM/50ML-% IV SOLN
1.0000 g | Freq: Three times a day (TID) | INTRAVENOUS | Status: AC
  Administered 2018-11-14 – 2018-11-15 (×2): 1 g via INTRAVENOUS
  Filled 2018-11-14 (×4): qty 50

## 2018-11-14 MED ORDER — FAMOTIDINE 20 MG PO TABS
20.0000 mg | ORAL_TABLET | Freq: Two times a day (BID) | ORAL | Status: DC
  Administered 2018-11-15 – 2018-11-21 (×13): 20 mg via ORAL
  Filled 2018-11-14 (×13): qty 1

## 2018-11-14 MED ORDER — MIDAZOLAM HCL 2 MG/2ML IJ SOLN
INTRAMUSCULAR | Status: AC
  Filled 2018-11-14: qty 2

## 2018-11-14 MED ORDER — LIDOCAINE 2% (20 MG/ML) 5 ML SYRINGE
INTRAMUSCULAR | Status: AC
  Filled 2018-11-14: qty 5

## 2018-11-14 MED ORDER — HYDROMORPHONE HCL 1 MG/ML IJ SOLN
1.0000 mg | INTRAMUSCULAR | Status: DC | PRN
  Administered 2018-11-14 – 2018-11-18 (×8): 1 mg via INTRAVENOUS
  Filled 2018-11-14 (×8): qty 1

## 2018-11-14 MED ORDER — ALBUTEROL SULFATE (2.5 MG/3ML) 0.083% IN NEBU: 3.0000 mL | INHALATION_SOLUTION | Freq: Four times a day (QID) | RESPIRATORY_TRACT | Status: DC | PRN

## 2018-11-14 MED ORDER — PHENYLEPHRINE 40 MCG/ML (10ML) SYRINGE FOR IV PUSH (FOR BLOOD PRESSURE SUPPORT)
PREFILLED_SYRINGE | INTRAVENOUS | Status: DC | PRN
  Administered 2018-11-14 (×2): 80 ug via INTRAVENOUS

## 2018-11-14 MED ORDER — CHLORHEXIDINE GLUCONATE CLOTH 2 % EX PADS
6.0000 | MEDICATED_PAD | Freq: Every day | CUTANEOUS | Status: DC
  Administered 2018-11-15 – 2018-11-20 (×5): 6 via TOPICAL

## 2018-11-14 MED ORDER — PROMETHAZINE HCL 6.25 MG/5ML PO SYRP
12.5000 mg | ORAL_SOLUTION | Freq: Three times a day (TID) | ORAL | Status: DC | PRN
  Filled 2018-11-14: qty 10

## 2018-11-14 MED ORDER — ACETAMINOPHEN 10 MG/ML IV SOLN
INTRAVENOUS | Status: DC | PRN
  Administered 2018-11-14: 1000 mg via INTRAVENOUS

## 2018-11-14 MED ORDER — LIDOCAINE 2% (20 MG/ML) 5 ML SYRINGE
INTRAMUSCULAR | Status: DC | PRN
  Administered 2018-11-14: 100 mg via INTRAVENOUS

## 2018-11-14 MED ORDER — FENTANYL CITRATE (PF) 250 MCG/5ML IJ SOLN
INTRAMUSCULAR | Status: AC
  Filled 2018-11-14: qty 5

## 2018-11-14 MED ORDER — DIAZEPAM 5 MG PO TABS
ORAL_TABLET | ORAL | Status: AC
  Filled 2018-11-14: qty 1

## 2018-11-14 MED ORDER — NIFEDIPINE ER OSMOTIC RELEASE 60 MG PO TB24
60.0000 mg | ORAL_TABLET | Freq: Every day | ORAL | Status: DC
  Administered 2018-11-15: 60 mg via ORAL
  Filled 2018-11-14: qty 1

## 2018-11-14 MED FILL — Vancomycin HCl For IV Soln 500 MG (Base Equivalent): INTRAVENOUS | Qty: 500 | Status: AC

## 2018-11-14 SURGICAL SUPPLY — 75 items
BAG DECANTER FOR FLEXI CONT (MISCELLANEOUS) ×3 IMPLANT
BENZOIN TINCTURE PRP APPL 2/3 (GAUZE/BANDAGES/DRESSINGS) ×3 IMPLANT
BLADE CLIPPER SURG (BLADE) IMPLANT
BUR CUTTER 7.0 ROUND (BURR) IMPLANT
BUR MATCHSTICK NEURO 3.0 LAGG (BURR) ×3 IMPLANT
CANISTER SUCT 3000ML PPV (MISCELLANEOUS) ×3 IMPLANT
CAP LCK SPNE (Orthopedic Implant) ×6 IMPLANT
CAP LOCK SPINE RADIUS (Orthopedic Implant) ×6 IMPLANT
CAP LOCKING (Orthopedic Implant) ×12 IMPLANT
CARTRIDGE OIL MAESTRO DRILL (MISCELLANEOUS) ×1 IMPLANT
CLOSURE STERI-STRIP 1/2X4 (GAUZE/BANDAGES/DRESSINGS) ×1
CLOSURE WOUND 1/2 X4 (GAUZE/BANDAGES/DRESSINGS) ×2
CLSR STERI-STRIP ANTIMIC 1/2X4 (GAUZE/BANDAGES/DRESSINGS) ×2 IMPLANT
CONT SPEC 4OZ CLIKSEAL STRL BL (MISCELLANEOUS) ×3 IMPLANT
COVER BACK TABLE 60X90IN (DRAPES) ×3 IMPLANT
DECANTER SPIKE VIAL GLASS SM (MISCELLANEOUS) ×3 IMPLANT
DERMABOND ADVANCED (GAUZE/BANDAGES/DRESSINGS) ×2
DERMABOND ADVANCED .7 DNX12 (GAUZE/BANDAGES/DRESSINGS) ×1 IMPLANT
DEVICE INTERBODY ELEVATE 23X9 (Cage) ×6 IMPLANT
DEVICE INTERBODY ELEVATE 9X23 (Cage) ×6 IMPLANT
DIFFUSER DRILL AIR PNEUMATIC (MISCELLANEOUS) ×3 IMPLANT
DRAPE C-ARM 42X72 X-RAY (DRAPES) ×6 IMPLANT
DRAPE HALF SHEET 40X57 (DRAPES) ×3 IMPLANT
DRAPE LAPAROTOMY 100X72X124 (DRAPES) ×3 IMPLANT
DRAPE SURG 17X23 STRL (DRAPES) ×12 IMPLANT
DRSG OPSITE POSTOP 4X10 (GAUZE/BANDAGES/DRESSINGS) ×3 IMPLANT
DRSG OPSITE POSTOP 4X6 (GAUZE/BANDAGES/DRESSINGS) IMPLANT
DURAPREP 26ML APPLICATOR (WOUND CARE) ×3 IMPLANT
ELECT REM PT RETURN 9FT ADLT (ELECTROSURGICAL) ×3
ELECTRODE REM PT RTRN 9FT ADLT (ELECTROSURGICAL) ×1 IMPLANT
EVACUATOR 1/8 PVC DRAIN (DRAIN) ×3 IMPLANT
GAUZE 4X4 16PLY RFD (DISPOSABLE) ×3 IMPLANT
GAUZE SPONGE 4X4 12PLY STRL (GAUZE/BANDAGES/DRESSINGS) IMPLANT
GLOVE BIO SURGEON STRL SZ 6.5 (GLOVE) ×4 IMPLANT
GLOVE BIO SURGEONS STRL SZ 6.5 (GLOVE) ×2
GLOVE BIOGEL PI IND STRL 6.5 (GLOVE) ×1 IMPLANT
GLOVE BIOGEL PI IND STRL 7.0 (GLOVE) ×1 IMPLANT
GLOVE BIOGEL PI IND STRL 7.5 (GLOVE) ×2 IMPLANT
GLOVE BIOGEL PI IND STRL 8 (GLOVE) ×1 IMPLANT
GLOVE BIOGEL PI INDICATOR 6.5 (GLOVE) ×2
GLOVE BIOGEL PI INDICATOR 7.0 (GLOVE) ×2
GLOVE BIOGEL PI INDICATOR 7.5 (GLOVE) ×4
GLOVE BIOGEL PI INDICATOR 8 (GLOVE) ×2
GLOVE ECLIPSE 7.5 STRL STRAW (GLOVE) ×3 IMPLANT
GLOVE ECLIPSE 9.0 STRL (GLOVE) ×9 IMPLANT
GLOVE SURG SS PI 7.5 STRL IVOR (GLOVE) ×12 IMPLANT
GOWN STRL REUS W/ TWL LRG LVL3 (GOWN DISPOSABLE) ×1 IMPLANT
GOWN STRL REUS W/ TWL XL LVL3 (GOWN DISPOSABLE) ×5 IMPLANT
GOWN STRL REUS W/TWL 2XL LVL3 (GOWN DISPOSABLE) ×3 IMPLANT
GOWN STRL REUS W/TWL LRG LVL3 (GOWN DISPOSABLE) ×2
GOWN STRL REUS W/TWL XL LVL3 (GOWN DISPOSABLE) ×10
KIT BASIN OR (CUSTOM PROCEDURE TRAY) ×3 IMPLANT
KIT TURNOVER KIT B (KITS) ×3 IMPLANT
MILL MEDIUM DISP (BLADE) ×3 IMPLANT
NEEDLE HYPO 22GX1.5 SAFETY (NEEDLE) ×3 IMPLANT
NS IRRIG 1000ML POUR BTL (IV SOLUTION) ×3 IMPLANT
OIL CARTRIDGE MAESTRO DRILL (MISCELLANEOUS) ×3
PACK LAMINECTOMY NEURO (CUSTOM PROCEDURE TRAY) ×3 IMPLANT
PATTIES SURGICAL 1X1 (DISPOSABLE) ×3 IMPLANT
ROD 5.5X60MM GREEN (Rod) ×3 IMPLANT
ROD 70MM (Rod) ×2 IMPLANT
ROD SPNL 70X5.5XNS TI RDS (Rod) ×1 IMPLANT
SCREW 5.75 X 635 (Screw) ×3 IMPLANT
SCREW 5.75X40M (Screw) ×18 IMPLANT
SPONGE LAP 4X18 RFD (DISPOSABLE) ×3 IMPLANT
SPONGE SURGIFOAM ABS GEL 100 (HEMOSTASIS) ×6 IMPLANT
STRIP CLOSURE SKIN 1/2X4 (GAUZE/BANDAGES/DRESSINGS) ×4 IMPLANT
SUT VIC AB 0 CT1 18XCR BRD8 (SUTURE) ×2 IMPLANT
SUT VIC AB 0 CT1 8-18 (SUTURE) ×4
SUT VIC AB 2-0 CT1 18 (SUTURE) ×6 IMPLANT
SUT VIC AB 3-0 SH 8-18 (SUTURE) ×9 IMPLANT
TOWEL GREEN STERILE (TOWEL DISPOSABLE) ×3 IMPLANT
TOWEL GREEN STERILE FF (TOWEL DISPOSABLE) ×3 IMPLANT
TRAY FOLEY MTR SLVR 16FR STAT (SET/KITS/TRAYS/PACK) ×3 IMPLANT
WATER STERILE IRR 1000ML POUR (IV SOLUTION) ×3 IMPLANT

## 2018-11-14 NOTE — Progress Notes (Signed)
  Pt sleeps when left alone,"I'm hurting" when awakened. She has significant edema lower extremities (not new, per Dr Annette Stable). She minimally wiggles toes to command, says she "couldn't move her feet"  preop.  Dr Annette Stable at bedside and aware.

## 2018-11-14 NOTE — Consult Note (Addendum)
[facility]                                                                                    Patient Demographics  Tonya Greene, is a 48 y.o. female  CSN: [id number]  MRN: [id number]  DOB - 02-29-1970  Admit Date - 11/14/2018  Outpatient Primary MD for the patient is Lin Landsman, MD   With History of -  Past Medical History:  Diagnosis Date  . Arthritis   . Depression   . Disc disease with myelopathy, lumbar   . Edema    bilateral lower extremities  . GERD (gastroesophageal reflux disease)   . Graves disease   . Hypertension   . Muscle spasms of both lower extremities   . Sleep apnea   . Stomach ulcer   . Thyroid cancer [facility])       Past Surgical History:  Procedure Laterality Date  . CARPAL TUNNEL RELEASE Right   . COLONOSCOPY     repair of fissures  . glands     under arms-removed  . TONSILLECTOMY      in for   No chief complaint on file.    HPI  Tonya Greene  is a 48 y.o. female, with past medical history significant for CAD, hypertension, depression, arthritis, Graves' disease who presented today for elective surgery for chronic and progressive back pain with bilateral lower extremity paresthesias and weakness.  Patient demonstrated evidence of unstable grade 1 L4-5 degenerative spondylolisthesis and underwent interbody fusion without complications. We were asked to consult due to complex medical issues. Patient is status post surgery today and she is groggy.  Denies any chest pains or shortness of breath.  Never had an echocardiogram in the past    Review of Systems    Could not be obtained due to patient's grogginess   Social History Social History   Tobacco Use  . Smoking status: Current Every Day Smoker    Packs/day: 0.50    Years: 35.00    Pack years: 17.50    Types: Cigarettes  . Smokeless tobacco: Never Used  Substance Use Topics  . Alcohol use: Yes     Family History Family History  Problem Relation Age of  Onset  . Diabetes Mother   . Hypertension Mother   . Diabetes Father   . Hypertension Father   . Heart failure Father      Prior to Admission medications   Medication Sig Start Date End Date Taking? Authorizing Provider  albuterol (PROVENTIL HFA;VENTOLIN HFA) 108 (90 Base) MCG/ACT inhaler Inhale 2 puffs into the lungs every 6 (six) hours as needed for wheezing or shortness of breath. 08/16/15  Yes Hoyt Koch, MD  cetirizine (ZYRTEC) 10 MG tablet Take 1 tablet (10 mg total) by mouth daily. Need annual visit for further refills Patient taking differently: Take 10 mg by mouth daily.  06/19/17  Yes Hoyt Koch, MD  famotidine (PEPCID) 20 MG tablet Take 20 mg by mouth 2 (two) times daily.   Yes [provider]  levothyroxine (SYNTHROID, LEVOTHROID) 137 MCG tablet TAKE 1 TABLET BY MOUTH DAILY BEFORE BREAKFAST Patient taking differently: Take 137 mcg  by mouth daily before breakfast.  01/03/18  Yes [name], MD  lisinopril (PRINIVIL,ZESTRIL) 10 MG tablet TAKE 1 TABLET BY MOUTH DAILY Patient taking differently: Take 10 mg by mouth daily.  03/28/17  Yes Hoyt Koch, MD  NIFEdipine (PROCARDIA-XL/ADALAT CC) 60 MG 24 hr tablet TAKE 1 TABLET BY MOUTH DAILY Patient taking differently: Take 60 mg by mouth daily.  03/28/17  Yes Hoyt Koch, MD  carisoprodol (SOMA) 350 MG tablet Take 350 mg by mouth 4 (four) times daily as needed for muscle spasms.    [provider]  furosemide (LASIX) 20 MG tablet Take 1 tablet (20 mg total) by mouth daily. 10/03/18 11/02/18  Fatima Blank, MD  oxyCODONE-acetaminophen (PERCOCET) 10-325 MG tablet Take 1 tablet by mouth 2 (two) times daily. 10/07/18   [provider]  oxyCODONE-acetaminophen (PERCOCET) 5-325 MG tablet Take 1 tablet by mouth every 6 (six) hours as needed. Patient not taking: Reported on 11/06/2018 08/04/18   Milton Ferguson, MD  promethazine (PHENERGAN) 6.25 MG/5ML syrup Take 10 mLs by mouth  3 (three) times daily. 10/07/18   [provider]    Allergies  Allergen Reactions  . Tape     Tears skin    Physical Exam  Vitals  Blood pressure 139/77, pulse 87, temperature 98.2 F (36.8 C), temperature source Oral, resp. rate 17, height 5' 5.5" (1.664 m), weight (!) 145.6 kg, last menstrual period 06/14/2018, SpO2 99 %. General appearance, groggy, obese HEENT no jaundice or pallor, orange hue discoloration noted Neck supple, no neck vein distention Chest clear and resonant Heart normal S1-S2, no murmurs gallops or rubs Abdomen obese bowel sounds present Extremities +2 edema with lower extremity stasis dermatitis, swelling and erythema Neuro patient moving all extremities but she is too groggy to perform a complete neurological exam    Data Review  CBC Recent Labs  Lab 11/11/18 1502  WBC 8.2  HGB 12.3  HCT 37.9  PLT 497*  MCV 98.4  MCH 31.9  MCHC 32.5  RDW 13.7  LYMPHSABS 2.9  MONOABS 0.6  EOSABS 0.2  BASOSABS 0.0   ------------------------------------------------------------------------------------------------------------------  Chemistries  Recent Labs  Lab 11/11/18 1502  NA 139  K 4.5  CL 103  CO2 25  GLUCOSE 96  BUN 9  CREATININE 0.71  CALCIUM 9.6   ------------------------------------------------------------------------------------------------------------------ estimated creatinine clearance is 123.8 mL/min (by C-G formula based on SCr of 0.71 mg/dL). ------------------------------------------------------------------------------------------------------------------ No results for input(s): TSH, T4TOTAL, T3FREE, THYROIDAB in the last 72 hours.  Invalid input(s): FREET3   Coagulation profile No results for input(s): INR, PROTIME in the last 168 hours. ------------------------------------------------------------------------------------------------------------------- No results for input(s): DDIMER in the last 72  hours. -------------------------------------------------------------------------------------------------------------------  Cardiac Enzymes No results for input(s): CKMB, TROPONINI, MYOGLOBIN in the last 168 hours.  Invalid input(s): CK ------------------------------------------------------------------------------------------------------------------ Invalid input(s): POCBNP   ---------------------------------------------------------------------------------------------------------------  Urinalysis    Component Value Date/Time   COLORURINE AMBER (A) 09/06/2018 1458   APPEARANCEUR CLEAR 09/06/2018 1458   LABSPEC >1.046 (H) 09/06/2018 1458   PHURINE 6.0 09/06/2018 1458   GLUCOSEU NEGATIVE 09/06/2018 1458   HGBUR LARGE (A) 09/06/2018 1458   BILIRUBINUR NEGATIVE 09/06/2018 1458   KETONESUR NEGATIVE 09/06/2018 1458   PROTEINUR 100 (A) 09/06/2018 1458   NITRITE NEGATIVE 09/06/2018 1458   LEUKOCYTESUR TRACE (A) 09/06/2018 1458    ----------------------------------------------------------------------------------------------------------------   Imaging results:   Dg Lumbar Spine 2-3 Views  Result Date: 11/14/2018 CLINICAL DATA:  Intraoperative imaging for lumbar fusion. EXAM: DG C-ARM 1-60 MIN; LUMBAR SPINE -  2-3 VIEW COMPARISON:  Plain films lumbar spine 10/15/2013. FINDINGS: Two fluoroscopic spot views of the lumbar spine demonstrate pedicle screws and interbody spacers in place at L3-4 and L4-5. No acute abnormality is identified. IMPRESSION: Intraoperative imaging for L3-5 fusion. Electronically Signed   By: Inge Rise M.D.   On: 11/14/2018 14:36   Mr Lumbar Spine Wo Contrast  Result Date: 10/18/2018 CLINICAL DATA:  48 year old female with low back pain for several months greater on the left. No known injury. History of thyroid cancer. EXAM: MRI LUMBAR SPINE WITHOUT CONTRAST TECHNIQUE: Multiplanar, multisequence MR imaging of the lumbar spine was performed. No intravenous  contrast was administered. COMPARISON:  CT Abdomen and Pelvis 09/06/2018. FINDINGS: Segmentation:  Normal on the comparison CT. Alignment: Stable from last month with preserved lumbar lordosis and grade 1 anterolisthesis of L4 on L5. Subtle retrolisthesis of L5 on S1. Vertebrae: Abnormal disc space loss and disc space signal at L4-L5, but no associated endplate marrow edema or paraspinal soft tissue inflammation at that level. Underlying mild degenerative chronic marrow signal changes in the lumbar spine. Intact visible sacrum and SI joints. No acute osseous abnormality identified. Conus medullaris and cauda equina: Conus extends to the L1 level. No lower spinal cord or conus signal abnormality. Paraspinal and other soft tissues: Large body habitus. Negative visible abdominal and pelvic viscera. Negative visualized posterior paraspinal soft tissues. Disc levels: No lower thoracic spinal stenosis. T12-L1: Mild disc bulge and posterior element hypertrophy. Mild T12 foraminal stenosis. L1-L2:  Mild to moderate facet hypertrophy.  No stenosis. L2-L3:  Moderate posterior element hypertrophy.  No stenosis. L3-L4: Mild disc space loss and circumferential disc bulge. Moderate to severe facet hypertrophy. Right side facet joint effusion. Mild to moderate spinal stenosis. No significant foraminal stenosis. L4-L5: Loss grade 1 anterolisthesis with. Vacuum severe disc space disc here last month, increased T2 and STIR signal in the disc space today. Bulky circumferential disc osteophyte complex with broad-based posterior disc extrusion or pseudo disc. Severe facet hypertrophy. Severe spinal and bilateral lateral recess stenosis (descending L5 nerve levels). Moderate to severe bilateral L4 foraminal stenosis. L5-S1: Disc desiccation with mild circumferential disc bulge. Moderate posterior element hypertrophy. No stenosis. IMPRESSION: 1. Grade 1 anterolisthesis at L4-L5 with chronic severe disc and posterior element degeneration.  Subsequent severe spinal, bilateral lateral recess, and up to severe bilateral foraminal stenosis. Abnormal signal in the disc space there is felt to be degenerative in nature, with no associated features of spinal infection at this time. 2. Up to moderate multifactorial spinal stenosis at L3-L4, also in part due to moderate to severe facet hypertrophy. Electronically Signed   By: Genevie Ann M.D.   On: 10/18/2018 20:26   Dg Chest Port 1 View  Result Date: 11/14/2018 CLINICAL DATA:  Status post central line placement. EXAM: PORTABLE CHEST 1 VIEW COMPARISON:  PA and lateral chest 10/02/2018. FINDINGS: New right IJ catheter is in place with its tip in the mid superior vena cava. No pneumothorax or pleural effusion. Lungs are clear. Heart size is normal. No acute bony abnormality. IMPRESSION: Right IJ catheter tip projects in the mid superior vena cava. No pneumothorax or pleural effusion. Electronically Signed   By: Inge Rise M.D.   On: 11/14/2018 14:14   Dg C-arm 1-60 Min  Result Date: 11/14/2018 CLINICAL DATA:  Intraoperative imaging for lumbar fusion. EXAM: DG C-ARM 1-60 MIN; LUMBAR SPINE - 2-3 VIEW COMPARISON:  Plain films lumbar spine 10/15/2013. FINDINGS: Two fluoroscopic spot views of the lumbar spine demonstrate  pedicle screws and interbody spacers in place at L3-4 and L4-5. No acute abnormality is identified. IMPRESSION: Intraoperative imaging for L3-5 fusion. Electronically Signed   By: Inge Rise M.D.   On: 11/14/2018 14:36      Assessment & Plan  Generalized edema/anasarca?  Myxedema  patient has history of hypothyroidism, uncontrolled last TSH 8/31 was 11.237.  Will repeat. Check echocardiogram Patient on Lasix chronically.   status post 2 level decompression and fusion for L4-5 degenerative spondylolisthesis  History of hypertension Continue with Procardia  Chronic pain with elevated lipase in the past Follow lipase level in a.m. Hold Soma for now Continue surgical  pain medication treatment  Lower extremity chronic dermatitis with erythema, probably due to fluid    Code Status full  Disposition Plan: Home  Time spent in minutes : 44 minutes  Condition GUARDED   @SIGNATURE @

## 2018-11-14 NOTE — Anesthesia Procedure Notes (Addendum)
Central Venous Catheter Insertion Performed by: Roberts Gaudy, MD, anesthesiologist Start/End10/10/2018 7:30 AM, 11/14/2018 7:40 AM Patient location: Pre-op. Preanesthetic checklist: patient identified, IV checked, site marked, risks and benefits discussed, surgical consent, monitors and equipment checked, pre-op evaluation, timeout performed and anesthesia consent Lidocaine 1% used for infiltration and patient sedated Hand hygiene performed  and maximum sterile barriers used  Catheter size: 8 Fr Total catheter length 16. Central line was placed.Double lumen Procedure performed using ultrasound guided technique. Ultrasound Notes:image(s) printed for medical record Attempts: 1 Following insertion, dressing applied and line sutured. Post procedure assessment: blood return through all ports  Patient tolerated the procedure well with no immediate complications.

## 2018-11-14 NOTE — Anesthesia Procedure Notes (Signed)
Procedure Name: Intubation Date/Time: 11/14/2018 8:12 AM Performed by: Trinna Post., CRNA Pre-anesthesia Checklist: Patient identified, Emergency Drugs available, Suction available, Patient being monitored and Timeout performed Patient Re-evaluated:Patient Re-evaluated prior to induction Oxygen Delivery Method: Circle system utilized Preoxygenation: Pre-oxygenation with 100% oxygen Induction Type: IV induction, Rapid sequence and Cricoid Pressure applied Ventilation: Mask ventilation without difficulty Laryngoscope Size: Mac and 3 Grade View: Grade I Tube type: Oral Tube size: 7.0 mm Number of attempts: 1 Airway Equipment and Method: Stylet Placement Confirmation: ETT inserted through vocal cords under direct vision,  positive ETCO2 and breath sounds checked- equal and bilateral Secured at: 21 cm Tube secured with: Tape Dental Injury: Teeth and Oropharynx as per pre-operative assessment

## 2018-11-14 NOTE — Op Note (Signed)
Date of procedure: 11/14/2018  Date of dictation: Same  Service: Neurosurgery  Preoperative diagnosis: L3-4 spondylosis with severe facet arthropathy and instability  L4-5 grade 1/grade 2 dynamic degenerative spondylolisthesis with severe stenosis and radiculopathy  Postoperative diagnosis: Same  Procedure Name: Bilateral L3-4 and L4-5 decompressive laminotomies with bilateral L3-L4 and L5 decompressive foraminotomies, more than would be required for simple interbody fusion alone.  L3-4 and L4-5 posterior lumbar interbody fusion utilizing interbody cages and locally harvested autograft  Ponte osteotomies bilaterally at L4-5 for deformity correction  L3-4-5 posterior lateral arthrodesis utilizing segmental pedicle screw fixation and local autograft  Surgeon:Mariachristina Holle A.Eleonor Ocon, M.D.  Asst. Surgeon: Reinaldo Meeker, NP  Anesthesia: General  Indication: 48 year old morbidly obese female with severe back and bilateral lower extremity pain paresthesias and weakness which is essentially rendering her nonambulatory.  Patient is in constant severe pain.  Work-up demonstrates evidence of a mobile degenerative spondylolisthesis with severe stenosis at L4-5.  She has significant facet arthropathy with facet joint diastases and some instability at L3-4 with resulted lateral recess stenosis at this level on the right side.  Patient is failed conservative management.  She presents now for two-level lumbar decompression and fusion in hopes of improving her symptoms.  Operative note: After induction anesthesia, patient position prone onto Wilson frame and appropriate padded.  Lumbar region prepped and draped sterilely.  Incision made from L2-S1.  Dissection performed bilaterally.  Retractor placed.  Fluoroscopy used.  Levels confirmed.  Decompressive laminotomies and facetectomies were then performed bilaterally at L3-4 and L4-5 removing the inferior two thirds of the lamina of L3 and L4 the entire inferior facets of L3  and L4 bilaterally and the superior aspect of the lamina of L4 and the superior aspect lamina of L5 and the majority of the superior facet of L4 and L5.  Ligament flavum elevated and resected.  Foraminotomies completed on the course the exiting L3-L4 and L5 nerve roots.  Ponte osteotomies completed laterally at L4-5 in order to facilitate reduction of her spondylolisthesis deformity.  Bilateral discectomies then performed at L3-4 and L4-5.  Disc spaces were then prepared for interbody fusion.  With a distractor placed in patient's right side starting at L3-4 disc space was prepared.  Soft tissue removed interspace.  With thecal sac and nerve roots protected a 9 mm [employer] standard lordotic expandable cage packed with locally harvested autograft was impacted in place and expanded to its full extent.  Distractor was removed patient's right side.  Dissipates then prepared on the right side.  Soft tissue removed the interspace.  Morselized autograft packed in the interspace.  A second cage packed with autograft was then impacted in place and expanded to its full extent.  Procedures then repeated at L4-5 after first reducing his spondylolisthesis with sequential distraction and manipulation.  9 mm extra lordotic expandable cages and locally harvested autograft was used at this level.  Pedicles at L3-4 and 5 were identified using surface landmarks and intraoperative fluoroscopy.  Superficial bone was removed overlying the pedicle.  Each pedicle was then probed using a pedicle all each pedicle tract was then probed and found to be solidly in the bone.  Each pedicle tract was then tapped with a screw tapped.  Screw temple was probed and found to be solidly in the bone.  5.75 mm radius brand screws from [employer] were placed bilaterally at L3-L4 and L5.  Final images reveal good position of the hardware and cages at the proper upper level with good reduction patient's deformity.  Transverse processes were  decorticated using high-speed drill.  Morselized autograft was packed posterior laterally for later fusion.  Short segment titanium rod placed over the screws at L4 and 5.  Locking caps placed over the screws.  Locking caps and engaged with a construct under mild compression.  Vancomycin powder was placed in deep wound space.  Wound is been closed in layers with Vicryl sutures.  A medium Hemovac drain was left in the deep wound space.  There were no apparent complications.  Patient tolerated the procedure well and she returns to the recovery room postop.

## 2018-11-14 NOTE — Transfer of Care (Signed)
Immediate Anesthesia Transfer of Care Note  Patient: Tonya Greene  Procedure(s) Performed: Posterior Lumbar Interbody Fusion - Lumbar Three-Lumbar four - Lumbar four-Lumbar five (N/A Back)  Patient Location: PACU  Anesthesia Type:General  Level of Consciousness: awake, alert  and oriented  Airway & Oxygen Therapy: Patient Spontanous Breathing and Patient connected to face mask oxygen  Post-op Assessment: Report given to RN and Post -op Vital signs reviewed and stable  Post vital signs: Reviewed and stable  Last Vitals:  Vitals Value Taken Time  BP 132/77 11/14/18 1326  Temp 36.8 C 11/14/18 1326  Pulse 87 11/14/18 1333  Resp 18 11/14/18 1333  SpO2 100 % 11/14/18 1333  Vitals shown include unvalidated device data.  Last Pain:  Vitals:   11/14/18 0648  TempSrc:   PainSc: 10-Worst pain ever         Complications: No apparent anesthesia complications

## 2018-11-14 NOTE — Brief Op Note (Signed)
11/14/2018  1:14 PM  PATIENT:  Tonya Greene  48 y.o. female  PRE-OPERATIVE DIAGNOSIS:  Spondylolisthesis  POST-OPERATIVE DIAGNOSIS:  Spondylolisthesis  PROCEDURE:  Procedure(s): Posterior Lumbar Interbody Fusion - Lumbar Three-Lumbar four - Lumbar four-Lumbar five (N/A)  SURGEON:  Surgeon(s) and Role:    Earnie Larsson, MD - Primary  PHYSICIAN ASSISTANT:   ASSISTANTSMearl Latin   ANESTHESIA:   general  EBL:  900 mL   BLOOD ADMINISTERED:none  DRAINS: (med) Hemovact drain(s) in the epidural space with  Suction Open   LOCAL MEDICATIONS USED:  MARCAINE     SPECIMEN:  No Specimen  DISPOSITION OF SPECIMEN:  N/A  COUNTS:  YES  TOURNIQUET:  * No tourniquets in log *  DICTATION: .Dragon Dictation  PLAN OF CARE: Admit to inpatient   PATIENT DISPOSITION:  PACU - hemodynamically stable.   Delay start of Pharmacological VTE agent (>24hrs) due to surgical blood loss or risk of bleeding: yes

## 2018-11-14 NOTE — Anesthesia Preprocedure Evaluation (Signed)
Anesthesia Evaluation  Patient identified by MRN, date of birth, ID band Patient awake    Reviewed: Allergy & Precautions, NPO status , Patient's Chart, lab work & pertinent test results  Airway Mallampati: II  TM Distance: <3 FB Neck ROM: Full    Dental no notable dental hx.    Pulmonary sleep apnea , Current Smoker,    Pulmonary exam normal breath sounds clear to auscultation + decreased breath sounds      Cardiovascular hypertension, Normal cardiovascular exam Rhythm:Regular Rate:Normal     Neuro/Psych negative neurological ROS  negative psych ROS   GI/Hepatic Neg liver ROS, GERD  ,  Endo/Other  Hypothyroidism Hyperthyroidism Morbid obesity  Renal/GU negative Renal ROS  negative genitourinary   Musculoskeletal negative musculoskeletal ROS (+)   Abdominal (+) + obese,   Peds negative pediatric ROS (+)  Hematology negative hematology ROS (+)   Anesthesia Other Findings   Reproductive/Obstetrics negative OB ROS                             Anesthesia Physical Anesthesia Plan  ASA: III  Anesthesia Plan: General   Post-op Pain Management:    Induction: Intravenous  PONV Risk Score and Plan: 2 and Ondansetron, Dexamethasone and Treatment may vary due to age or medical condition  Airway Management Planned: Oral ETT  Additional Equipment:   Intra-op Plan:   Post-operative Plan: Extubation in OR  Informed Consent: I have reviewed the patients History and Physical, chart, labs and discussed the procedure including the risks, benefits and alternatives for the proposed anesthesia with the patient or authorized representative who has indicated his/her understanding and acceptance.     Dental advisory given  Plan Discussed with: CRNA and Surgeon  Anesthesia Plan Comments:         Anesthesia Quick Evaluation

## 2018-11-14 NOTE — Anesthesia Postprocedure Evaluation (Signed)
Anesthesia Post Note  Patient: Tonya Greene  Procedure(s) Performed: Posterior Lumbar Interbody Fusion - Lumbar Three-Lumbar four - Lumbar four-Lumbar five (N/A Back)     Patient location during evaluation: PACU Anesthesia Type: General Level of consciousness: awake and alert Pain management: pain level controlled Vital Signs Assessment: post-procedure vital signs reviewed and stable Respiratory status: spontaneous breathing, nonlabored ventilation, respiratory function stable and patient connected to nasal cannula oxygen Cardiovascular status: blood pressure returned to baseline and stable Postop Assessment: no apparent nausea or vomiting Anesthetic complications: no    Last Vitals:  Vitals:   11/14/18 1430 11/14/18 1504  BP:  139/77  Pulse: 90 87  Resp: 17   Temp: (!) 36.3 C 36.8 C  SpO2: 98% 99%    Last Pain:  Vitals:   11/14/18 1504  TempSrc: Oral  PainSc:                  Tekeisha Hakim S

## 2018-11-14 NOTE — H&P (Signed)
Tonya Greene is an 48 y.o. female.   Chief Complaint: Back pain HPI: 48 year old morbidly obese female with chronic and progressive back pain with bilateral lower extremity pain paresthesias and weakness.  Work-up demonstrates evidence of a unstable grade 1 L4-5 degenerative spondylolisthesis with severe central and foraminal stenosis.  Patient also with marked right-sided L3-4 facet arthropathy with right-sided lateral recess stenosis.  Patient is failed conservative management.  She presents now for two-level lumbar decompression and fusion in hopes of improving her symptoms.  Past Medical History:  Diagnosis Date  . Arthritis   . Depression   . Disc disease with myelopathy, lumbar   . Edema    bilateral lower extremities  . GERD (gastroesophageal reflux disease)   . Graves disease   . Hypertension   . Muscle spasms of both lower extremities   . Sleep apnea   . Stomach ulcer   . Thyroid cancer [facility])     Past Surgical History:  Procedure Laterality Date  . CARPAL TUNNEL RELEASE Right   . COLONOSCOPY     repair of fissures  . glands     under arms-removed  . TONSILLECTOMY      Family History  Problem Relation Age of Onset  . Diabetes Mother   . Hypertension Mother   . Diabetes Father   . Hypertension Father   . Heart failure Father    Social History:  reports that she has been smoking cigarettes. She has a 17.50 pack-year smoking history. She has never used smokeless tobacco. She reports current alcohol use. She reports current drug use. Drug: Marijuana.  Allergies:  Allergies  Allergen Reactions  . Tape     Tears skin    Medications Prior to Admission  Medication Sig Dispense Refill  . albuterol (PROVENTIL HFA;VENTOLIN HFA) 108 (90 Base) MCG/ACT inhaler Inhale 2 puffs into the lungs every 6 (six) hours as needed for wheezing or shortness of breath. 1 Inhaler 2  . cetirizine (ZYRTEC) 10 MG tablet Take 1 tablet (10 mg total) by mouth daily. Need annual visit for  further refills (Patient taking differently: Take 10 mg by mouth daily. ) 90 tablet 0  . famotidine (PEPCID) 20 MG tablet Take 20 mg by mouth 2 (two) times daily.    Marland Kitchen levothyroxine (SYNTHROID, LEVOTHROID) 137 MCG tablet TAKE 1 TABLET BY MOUTH DAILY BEFORE BREAKFAST (Patient taking differently: Take 137 mcg by mouth daily before breakfast. ) 30 tablet 0  . lisinopril (PRINIVIL,ZESTRIL) 10 MG tablet TAKE 1 TABLET BY MOUTH DAILY (Patient taking differently: Take 10 mg by mouth daily. ) 90 tablet 1  . NIFEdipine (PROCARDIA-XL/ADALAT CC) 60 MG 24 hr tablet TAKE 1 TABLET BY MOUTH DAILY (Patient taking differently: Take 60 mg by mouth daily. ) 90 tablet 1  . carisoprodol (SOMA) 350 MG tablet Take 350 mg by mouth 4 (four) times daily as needed for muscle spasms.    . furosemide (LASIX) 20 MG tablet Take 1 tablet (20 mg total) by mouth daily. 30 tablet 0  . oxyCODONE-acetaminophen (PERCOCET) 10-325 MG tablet Take 1 tablet by mouth 2 (two) times daily.    Marland Kitchen oxyCODONE-acetaminophen (PERCOCET) 5-325 MG tablet Take 1 tablet by mouth every 6 (six) hours as needed. (Patient not taking: Reported on 11/06/2018) 20 tablet 0  . promethazine (PHENERGAN) 6.25 MG/5ML syrup Take 10 mLs by mouth 3 (three) times daily.      Results for orders placed or performed during the hospital encounter of 11/14/18 (from the past 48 hour(s))  Pregnancy, urine POC     Status: None   Collection Time: 11/14/18  7:04 AM  Result Value Ref Range   Preg Test, Ur NEGATIVE NEGATIVE    Comment:        THE SENSITIVITY OF THIS METHODOLOGY IS >24 mIU/mL    No results found.  Pertinent items noted in HPI and remainder of comprehensive ROS otherwise negative.  Blood pressure 127/62, pulse 93, temperature 98.1 F (36.7 C), temperature source Oral, resp. rate 18, height 5' 5.5" (1.664 m), weight (!) 145.6 kg, last menstrual period 06/14/2018, SpO2 100 %. Patient is awake and alert.  She is oriented and reasonably appropriate.  Speech is  fluent.  Judgment and insight are intact.  Cranial nerve function normal bilateral.  Motor examination with mild weakness of dorsiflexion in both distal lower extremities.  Also with right-sided quadriceps weakness.  Sensory examination with decrease sensation pinprick light touch in her right L4 and L5 dermatomes.  Deep tender versus normal active.  No evidence of long track signs.  Gait antalgic.  Posture flexed peer examination of the head ears eyes nose throat is unremarked.  Chest and abdomen are obese but otherwise unremarkable.  Extremities are free from injury or deformity.  Assessment/Plan L4-5 degenerative spondylolisthesis with severe stenosis and radiculopathy.  L3-4 facet arthropathy and lateral recess stenosis.  Plan bilateral L3-4 and L4-5 decompressive laminotomies and foraminotomies followed by posterior lumbar interbody fusion utilizing interbody cages, local harvested autograft, and augmented with posterior lateral arthrodesis utilizing segmental pedicle screw fixation and local autograft.  Risks and benefits of been explained.  Patient wishes to proceed. Mallie Mussel A Nima Bamburg 11/14/2018, 7:46 AM

## 2018-11-15 ENCOUNTER — Other Ambulatory Visit (HOSPITAL_COMMUNITY): Payer: Medicare Other

## 2018-11-15 ENCOUNTER — Inpatient Hospital Stay (HOSPITAL_COMMUNITY): Payer: Medicare Other

## 2018-11-15 DIAGNOSIS — Z95828 Presence of other vascular implants and grafts: Secondary | ICD-10-CM

## 2018-11-15 DIAGNOSIS — Z419 Encounter for procedure for purposes other than remedying health state, unspecified: Secondary | ICD-10-CM

## 2018-11-15 LAB — COMPREHENSIVE METABOLIC PANEL
ALT: 13 U/L (ref 0–44)
AST: 32 U/L (ref 15–41)
Albumin: 3 g/dL — ABNORMAL LOW (ref 3.5–5.0)
Alkaline Phosphatase: 49 U/L (ref 38–126)
Anion gap: 8 (ref 5–15)
BUN: 8 mg/dL (ref 6–20)
CO2: 26 mmol/L (ref 22–32)
Calcium: 8.9 mg/dL (ref 8.9–10.3)
Chloride: 103 mmol/L (ref 98–111)
Creatinine, Ser: 0.68 mg/dL (ref 0.44–1.00)
GFR calc Af Amer: >60 mL/min (ref >60)
GFR calc non Af Amer: >60 mL/min (ref >60)
Glucose, Bld: 112 mg/dL — ABNORMAL HIGH (ref 70–99)
Potassium: 4.3 mmol/L (ref 3.5–5.1)
Sodium: 137 mmol/L (ref 135–145)
Total Bilirubin: 0.4 mg/dL (ref 0.3–1.2)
Total Protein: 5.9 g/dL — ABNORMAL LOW (ref 6.5–8.1)

## 2018-11-15 LAB — CBC WITH DIFFERENTIAL/PLATELET
Abs Immature Granulocytes: 0.06 10*3/uL (ref 0.00–0.07)
Basophils Absolute: 0 10*3/uL (ref 0.0–0.1)
Basophils Relative: 0 %
Eosinophils Absolute: 0 10*3/uL (ref 0.0–0.5)
Eosinophils Relative: 0 %
HCT: 29.2 % — ABNORMAL LOW (ref 36.0–46.0)
Hemoglobin: 9.5 g/dL — ABNORMAL LOW (ref 12.0–15.0)
Immature Granulocytes: 1 %
Lymphocytes Relative: 25 %
Lymphs Abs: 2.8 10*3/uL (ref 0.7–4.0)
MCH: 31.6 pg (ref 26.0–34.0)
MCHC: 32.5 g/dL (ref 30.0–36.0)
MCV: 97 fL (ref 80.0–100.0)
Monocytes Absolute: 0.9 10*3/uL (ref 0.1–1.0)
Monocytes Relative: 8 %
Neutro Abs: 7.2 10*3/uL (ref 1.7–7.7)
Neutrophils Relative %: 66 %
Platelets: 319 10*3/uL (ref 150–400)
RBC: 3.01 MIL/uL — ABNORMAL LOW (ref 3.87–5.11)
RDW: 13.6 % (ref 11.5–15.5)
WBC: 10.9 10*3/uL — ABNORMAL HIGH (ref 4.0–10.5)
nRBC: 0 % (ref 0.0–0.2)

## 2018-11-15 LAB — URINALYSIS, ROUTINE W REFLEX MICROSCOPIC
Bacteria, UA: NONE SEEN
Bilirubin Urine: NEGATIVE
Glucose, UA: NEGATIVE mg/dL
Ketones, ur: NEGATIVE mg/dL
Nitrite: NEGATIVE
Protein, ur: NEGATIVE mg/dL
Specific Gravity, Urine: 1.014 (ref 1.005–1.030)
pH: 5 (ref 5.0–8.0)

## 2018-11-15 LAB — MAGNESIUM: Magnesium: 2 mg/dL (ref 1.7–2.4)

## 2018-11-15 LAB — T4, FREE: Free T4: 1.11 ng/dL (ref 0.61–1.12)

## 2018-11-15 LAB — TSH: TSH: 13.702 u[IU]/mL — ABNORMAL HIGH (ref 0.350–4.500)

## 2018-11-15 MED ORDER — POTASSIUM CHLORIDE CRYS ER 20 MEQ PO TBCR
20.0000 meq | EXTENDED_RELEASE_TABLET | Freq: Two times a day (BID) | ORAL | Status: AC
  Administered 2018-11-15 – 2018-11-16 (×4): 20 meq via ORAL
  Filled 2018-11-15 (×4): qty 1

## 2018-11-15 MED ORDER — FUROSEMIDE 10 MG/ML IJ SOLN
20.0000 mg | Freq: Four times a day (QID) | INTRAMUSCULAR | Status: AC
  Administered 2018-11-15 – 2018-11-16 (×4): 20 mg via INTRAVENOUS
  Filled 2018-11-15 (×4): qty 2

## 2018-11-15 MED ORDER — LEVOTHYROXINE SODIUM 75 MCG PO TABS
150.0000 ug | ORAL_TABLET | Freq: Every day | ORAL | Status: DC
  Administered 2018-11-16 – 2018-11-21 (×6): 150 ug via ORAL
  Filled 2018-11-15 (×6): qty 2

## 2018-11-15 NOTE — Evaluation (Signed)
Physical Therapy Evaluation Patient Details Name: KALYCE BALEK MRN: [id number] DOB: 09/12/1970 Today's Date: 11/15/2018   History of Present Illness  48 y.o. female, with past medical history significant for CAD, hypertension, depression, arthritis, Graves' disease, and hypothyroidism. She underwent PLIF 11/14/18.    Clinical Impression  Patient is s/p above surgery resulting in the deficits listed below (see PT Problem List). PTA pt lived at home with family who provided assist for mobility and ADLs. Pt appears to have a sedentary lifestyle. She sleeps in a recliner/lift chair, uses BSC for toileting, and wheelchair for primary mobility. On eval, she required total assist at rolling attempts in bed using rails. Mobility limited by pain, edema and little effort from pt. Patient will benefit from skilled PT to increase their independence and safety with mobility (while adhering to their precautions) to allow discharge to the venue listed below.     Follow Up Recommendations SNF    Equipment Recommendations  None recommended by PT    Recommendations for Other Services       Precautions / Restrictions Precautions Precautions: Fall;Back Precaution Comments: Pt educated on 3/3 back precautions. Required Braces or Orthoses: Spinal Brace Spinal Brace: Lumbar corset;Applied in sitting position Restrictions Other Position/Activity Restrictions: brace not available at time of eval      Mobility  Bed Mobility Overal bed mobility: Needs Assistance Bed Mobility: Rolling Rolling: +2 for physical assistance;Total assist         General bed mobility comments: total assist for rolling attempts in bed with pt using rails. Very minimal mobility tolerance due to pain and edema. Little effort from pt.  Transfers                 General transfer comment: unable  Ambulation/Gait             General Gait Details: unable  Stairs            Wheelchair Mobility     Modified Rankin (Stroke Patients Only)       Balance                                             Pertinent Vitals/Pain Pain Assessment: 0-10 Pain Score: 10-Worst pain ever Pain Location: back, BLE with mobility attempts Pain Descriptors / Indicators: Grimacing;Guarding;Moaning;Spasm Pain Intervention(s): Limited activity within patient's tolerance;Premedicated before session;Monitored during session    Home Living Family/patient expects to be discharged to:: Private residence Living Arrangements: Parent;Other relatives Available Help at Discharge: Family;Available 24 hours/day Type of Home: Mobile home Home Access: Stairs to enter Entrance Stairs-Rails: Right;Left Entrance Stairs-Number of Steps: 6 Home Layout: One level Home Equipment: Walker - 4 wheels;Bedside commode;Wheelchair - manual      Prior Function Level of Independence: Needs assistance   Gait / Transfers Assistance Needed: Pt sleeps in recliner/lift chair. Amb very short distances with 4-wheeled Up Walker. Wheelchair for primary mobility. Pt attempts to self propel.  ADL's / Homemaking Assistance Needed: Family assists with all ADLs. She gets sponge bathed and uses BSC for toileting.        Hand Dominance        Extremity/Trunk Assessment   Upper Extremity Assessment Upper Extremity Assessment: Defer to OT evaluation    Lower Extremity Assessment Lower Extremity Assessment: Generalized weakness;RLE deficits/detail;LLE deficits/detail RLE Deficits / Details: ROM/mobility limited by pain and significant edema bilat LLE Deficits /  Details: ROM/mobility limited by pain and significant edema bilat    Cervical / Trunk Assessment Cervical / Trunk Assessment: Other exceptions Cervical / Trunk Exceptions: s/p PLIF  Communication   Communication: No difficulties  Cognition Arousal/Alertness: Awake/alert Behavior During Therapy: WFL for tasks assessed/performed Overall Cognitive  Status: Within Functional Limits for tasks assessed                                 General Comments: self limiting, giving very little effort      General Comments      Exercises     Assessment/Plan    PT Assessment Patient needs continued PT services  PT Problem List Decreased strength;Decreased mobility;Obesity;Decreased knowledge of precautions;Decreased range of motion;Decreased activity tolerance;Pain;Decreased balance;Decreased knowledge of use of DME       PT Treatment Interventions Therapeutic activities;Gait training;Therapeutic exercise;Patient/family education;Balance training;Functional mobility training    PT Goals (Current goals can be found in the Care Plan section)  Acute Rehab PT Goals Patient Stated Goal: decrease pain and edema PT Goal Formulation: With patient Time For Goal Achievement: 11/29/18 Potential to Achieve Goals: Fair    Frequency Min 5X/week   Barriers to discharge        Co-evaluation               AM-PAC PT "6 Clicks" Mobility  Outcome Measure Help needed turning from your back to your side while in a flat bed without using bedrails?: Total Help needed moving from lying on your back to sitting on the side of a flat bed without using bedrails?: Total Help needed moving to and from a bed to a chair (including a wheelchair)?: Total Help needed standing up from a chair using your arms (e.g., wheelchair or bedside chair)?: Total Help needed to walk in hospital room?: Total Help needed climbing 3-5 steps with a railing? : Total 6 Click Score: 6    End of Session Equipment Utilized During Treatment: Oxygen Activity Tolerance: Patient limited by pain Patient left: in bed;with call bell/phone within reach Nurse Communication: Mobility status PT Visit Diagnosis: Other abnormalities of gait and mobility (R26.89);Muscle weakness (generalized) (M62.81);Pain    Time: [id number] PT Time Calculation (min) (ACUTE ONLY): 13  min   Charges:   PT Evaluation $PT Eval Moderate Complexity: 1 Mod          Lorrin Goodell, PT  Office # [phone] Pager [phone]   Lorriane Shire 11/15/2018, 1:11 PM

## 2018-11-15 NOTE — Progress Notes (Signed)
Dr. Zigmund Daniel ordered Lasix IV q 6 hours to diurese patient. Foley in place post-op, due to be removed. Due to patient's pain, edema, immobility, Dr. Zigmund Daniel was asked if Foley could be left in one more day. PT/OT also confirmed she was not cooperative/able to move and is very anxious about spasms/pain in left leg. Foley will be left in at this time per this conversation and Dr. Zigmund Daniel agreed this was okay given then situation. Simmie Davies RN

## 2018-11-15 NOTE — Progress Notes (Signed)
PROGRESS NOTE    Tonya Greene  [id number] DOB: 14-Dec-1970 DOA: 11/14/2018 PCP: Lin Landsman, MD  Brief Narrative: 48 year old female with history of Graves' disease, thyroid cancer, hypertension, depression, arthritis CAD,, status post L4-L5 interbody fusion. When I saw her this morning she was sleeping but easily arousable.  She reported she has been having progressively worsening edema and weight gain for the last few months.  She has been to her PCP.  She has progressive worsening of shortness of breath, denies any chest pain during diarrhea. Today she complains of severe spasm in the left lower extremity.  Assessment & Plan:   Active Problems:   Degenerative spondylolisthesis   S/P lumbar fusion  #1 anasarca unclear etiology patient has progressively worsening weight gain for the last few months.  Repeat TSH is 13.  Free T3 and T4 pending.  She is on Synthroid at home 137 MCG daily.  I will increase his steroids to 150 MCG daily.  I have started her on Lasix 20 mg IV since her blood pressure is running soft.  Check echocardiogram to check ejection fraction.  Check chest x-ray.  Check UA to rule out proteinuria.  Check CBC and CMP.  She has edema all the way to the upper thighs 2+.  Addendum renal functions normal LFTs normal with albumin of 3.0.  CBC unremarkable.  Chest x-ray with fluid overload.  Echo pending today.  #2 status post decompression and fusion of L4-L5 secondary to degenerative spondylolisthesis.  #3 hypertension I will hold her Zestril and Procardia since her blood pressure is running soft and I still need to give her Lasix to control her edema.  #4 hypothyroidism on Synthroid 137 MCG at home.  TSH level is 13 T3-T4 pending.  I have increased her dose of Synthroid to 150 MCG.  Patient will need follow-up test in 6 weeks.   Estimated body mass index is 52.6 kg/m as calculated from the following:   Height as of this encounter: 5' 5.5" (1.664 m).   Weight as of  this encounter: 145.6 kg.  DVT prophylaxis: SCD Code Status: Full code   Subjective: Patient resting in bed easy to wake her up follows a conversation awake complains of rattling in her lungs and severe spasm in the left lower extremity.  Objective: Vitals:   11/14/18 1957 11/14/18 2322 11/15/18 0435 11/15/18 0900  BP: 104/78 120/63 114/64 (!) 109/52  Pulse: 86 87 88 81  Resp: (!) 22 20 20 17   Temp: (!) 97.5 F (36.4 C) 98.1 F (36.7 C) 98 F (36.7 C) 98.2 F (36.8 C)  TempSrc: Oral Oral Oral Oral  SpO2: 99%   100%  Weight:      Height:        Intake/Output Summary (Last 24 hours) at 11/15/2018 1359 Last data filed at 11/15/2018 1003 Gross per 24 hour  Intake 278 ml  Output 3475 ml  Net -3197 ml   Filed Weights   11/14/18 0635  Weight: (!) 145.6 kg    Examination:  General exam: Appears calm and comfortable  Respiratory system: Clear to auscultation. Respiratory effort normal. Cardiovascular system: Scattered rhonchi in both lung fields S1 & S2 heard, RRR. No JVD, murmurs, rubs, gallops or clicks. No pedal edema. Gastrointestinal system: Abdomen is nondistended, soft and nontender. No organomegaly or masses felt. Normal bowel sounds heard. Central nervous system: Alert and oriented. No focal neurological deficits. Extremities 2-3+ edema all the way up to her upper thighs. Skin: No rashes, lesions  or ulcers Psychiatry: Judgement and insight appear normal. Mood & affect appropriate.     Data Reviewed: I have personally reviewed following labs and imaging studies  CBC: Recent Labs  Lab 11/11/18 1502 11/15/18 0950  WBC 8.2 10.9*  NEUTROABS 4.4 7.2  HGB 12.3 9.5*  HCT 37.9 29.2*  MCV 98.4 97.0  PLT 497* [id number]   Basic Metabolic Panel: Recent Labs  Lab 11/11/18 1502 11/15/18 0950  NA 139 137  K 4.5 4.3  CL 103 103  CO2 25 26  GLUCOSE 96 112*  BUN 9 8  CREATININE 0.71 0.68  CALCIUM 9.6 8.9  MG  --  2.0   GFR: Estimated Creatinine Clearance: 123.8  mL/min (by C-G formula based on SCr of 0.68 mg/dL). Liver Function Tests: Recent Labs  Lab 11/15/18 0950  AST 32  ALT 13  ALKPHOS 49  BILITOT 0.4  PROT 5.9*  ALBUMIN 3.0*   No results for input(s): LIPASE, AMYLASE in the last 168 hours. No results for input(s): AMMONIA in the last 168 hours. Coagulation Profile: No results for input(s): INR, PROTIME in the last 168 hours. Cardiac Enzymes: No results for input(s): CKTOTAL, CKMB, CKMBINDEX, TROPONINI in the last 168 hours. BNP (last 3 results) No results for input(s): PROBNP in the last 8760 hours. HbA1C: No results for input(s): HGBA1C in the last 72 hours. CBG: No results for input(s): GLUCAP in the last 168 hours. Lipid Profile: No results for input(s): CHOL, HDL, LDLCALC, TRIG, CHOLHDL, LDLDIRECT in the last 72 hours. Thyroid Function Tests: Recent Labs    11/15/18 0950  TSH 13.702*   Anemia Panel: No results for input(s): VITAMINB12, FOLATE, FERRITIN, TIBC, IRON, RETICCTPCT in the last 72 hours. Sepsis Labs: No results for input(s): PROCALCITON, LATICACIDVEN in the last 168 hours.  Recent Results (from the past 240 hour(s))  Novel Coronavirus, NAA (Hosp order, Send-out to Ref Lab; TAT 18-24 hrs     Status: None   Collection Time: 11/11/18 12:34 PM   Specimen: Nasopharyngeal Swab; Respiratory  Result Value Ref Range Status   SARS-CoV-2, NAA NOT DETECTED NOT DETECTED Final    Comment: (NOTE) This nucleic acid amplification test was developed and its performance characteristics determined by [employer]. Nucleic acid amplification tests include PCR and TMA. This test has not been FDA cleared or approved. This test has been authorized by FDA under an Emergency Use Authorization (EUA). This test is only authorized for the duration of time the declaration that circumstances exist justifying the authorization of the emergency use of in vitro diagnostic tests for detection of SARS-CoV-2 virus and/or diagnosis  of COVID-19 infection under section 564(b)(1) of the Act, 21 U.S.C. [id number]) (1), unless the authorization is terminated or revoked sooner. When diagnostic testing is negative, the possibility of a false negative result should be considered in the context of a patient's recent exposures and the presence of clinical signs and symptoms consistent with COVID-19. An individual without symptoms of COVID- 19 and who is not shedding SARS-CoV-2 vi rus would expect to have a negative (not detected) result in this assay. Performed At: [facility] [address] [place], [place] [id number] Rush Farmer MD [id number]    [place]  Final    Comment: Performed at [facility], [place] [address] [place], [place] [address]  Surgical pcr screen     Status: None   Collection Time: 11/11/18  3:00 PM   Specimen: Nasal Mucosa; Nasal Swab  Result Value Ref Range Status  MRSA, PCR NEGATIVE NEGATIVE Final   Staphylococcus aureus NEGATIVE NEGATIVE Final    Comment: (NOTE) The Xpert SA Assay (FDA approved for NASAL specimens in patients 50 years of age and older), is one component of a comprehensive surveillance program. It is not intended to diagnose infection nor to guide or monitor treatment. Performed at [facility], [place] [address] [place], [place] [address]          Radiology Studies: Dg Lumbar Spine 2-3 Views  Result Date: 11/14/2018 CLINICAL DATA:  Intraoperative imaging for lumbar fusion. EXAM: DG C-ARM 1-60 MIN; LUMBAR SPINE - 2-3 VIEW COMPARISON:  Plain films lumbar spine 10/15/2013. FINDINGS: Two fluoroscopic spot views of the lumbar spine demonstrate pedicle screws and interbody spacers in place at L3-4 and L4-5. No acute abnormality is identified. IMPRESSION: Intraoperative imaging for L3-5 fusion. Electronically Signed   By: Inge Rise M.D.   On: 11/14/2018 14:36   Dg Chest Port 1 View  Result Date: 11/15/2018 CLINICAL DATA:   Reason for exam: hypoxemia Hx HTN EXAM: PORTABLE CHEST 1 VIEW COMPARISON:  11/14/2018 FINDINGS: Central venous line unchanged. Stable cardiac silhouette. Mild central venous congestion. No overt pulmonary edema. No pneumothorax. No consolidation. IMPRESSION: No interval change.  Mild venous congestion Electronically Signed   By: Suzy Bouchard M.D.   On: 11/15/2018 11:06   Dg Chest Port 1 View  Result Date: 11/14/2018 CLINICAL DATA:  Status post central line placement. EXAM: PORTABLE CHEST 1 VIEW COMPARISON:  PA and lateral chest 10/02/2018. FINDINGS: New right IJ catheter is in place with its tip in the mid superior vena cava. No pneumothorax or pleural effusion. Lungs are clear. Heart size is normal. No acute bony abnormality. IMPRESSION: Right IJ catheter tip projects in the mid superior vena cava. No pneumothorax or pleural effusion. Electronically Signed   By: Inge Rise M.D.   On: 11/14/2018 14:14   Dg C-arm 1-60 Min  Result Date: 11/14/2018 CLINICAL DATA:  Intraoperative imaging for lumbar fusion. EXAM: DG C-ARM 1-60 MIN; LUMBAR SPINE - 2-3 VIEW COMPARISON:  Plain films lumbar spine 10/15/2013. FINDINGS: Two fluoroscopic spot views of the lumbar spine demonstrate pedicle screws and interbody spacers in place at L3-4 and L4-5. No acute abnormality is identified. IMPRESSION: Intraoperative imaging for L3-5 fusion. Electronically Signed   By: Inge Rise M.D.   On: 11/14/2018 14:36        Scheduled Meds: . Chlorhexidine Gluconate Cloth  6 each Topical Daily  . famotidine  20 mg Oral BID  . furosemide  20 mg Intravenous Q6H  . levothyroxine  137 mcg Oral QAC breakfast  . lisinopril  10 mg Oral Daily  . loratadine  10 mg Oral Daily  . NIFEdipine  60 mg Oral Daily  . potassium chloride  20 mEq Oral BID  . sodium chloride flush  3 mL Intravenous Q12H   Continuous Infusions: . sodium chloride 250 mL (11/14/18 1618)     LOS: 1 day     Georgette Shell, MD [facility]  If 7PM-7AM, please contact night-coverage [email or web address] Password [email or web address] 11/15/2018, 1:59 PM

## 2018-11-15 NOTE — Evaluation (Signed)
Occupational Therapy Evaluation Patient Details Name: Tonya Greene MRN: [id number] DOB: [birth year] Today's Date: 11/15/2018    History of Present Illness 48 y.o. female, with past medical history significant for CAD, hypertension, depression, arthritis, Graves' disease, and hypothyroidism. She underwent PLIF 11/14/18.   Clinical Impression   Pt had PLIF. Pt currently with functional limitations due to the deficits listed below (see OT Problem List). Pt completed very limited activity due to reports of pain and spasms. Patient did not want to complete mobility in this session as reporting they did enough of that earlier. Pt requires max assist to total assist for all activity at this time. PLOF pt was limited to activity as would go from lift chair to 3 in 1 commode and would sleep in lift chair.  Pt will benefit from skilled OT to increase their safety and independence with ADL and functional mobility for ADL to facilitate discharge to venue listed below.     Follow Up Recommendations  SNF;Supervision/Assistance - 24 hour    Equipment Recommendations       Recommendations for Other Services       Precautions / Restrictions Precautions Precautions: Fall;Back Precaution Comments: Pt reported 1/3 Required Braces or Orthoses: Spinal Brace Spinal Brace: Lumbar corset;Applied in sitting position Restrictions Weight Bearing Restrictions: No Other Position/Activity Restrictions: brace not available at time of eval      Mobility Bed Mobility Overal bed mobility: Needs Assistance Bed Mobility: Rolling Rolling: +2 for physical assistance;Total assist         General bed mobility comments: total assist for rolling attempts in bed with pt using rails. Very minimal mobility tolerance due to pain and edema. Little effort from pt.  Transfers Overall transfer level: (pt declining)               General transfer comment: unable    Balance Overall balance assessment: Needs  assistance                                         ADL either performed or assessed with clinical judgement   ADL Overall ADL's : Needs assistance/impaired Eating/Feeding: Set up;Bed level   Grooming: Bed level;Moderate assistance   Upper Body Bathing: Bed level;Total assistance   Lower Body Bathing: Bed level;Maximal assistance;+2 for physical assistance;+2 for safety/equipment   Upper Body Dressing : Maximal assistance;Bed level   Lower Body Dressing: Maximal assistance;+2 for physical assistance   Toilet Transfer: Total assistance   Toileting- Clothing Manipulation and Hygiene: +2 for physical assistance;+2 for safety/equipment;Total assistance   Tub/ Shower Transfer: Total assistance   Functional mobility during ADLs: Total assistance       Vision Patient Visual Report: No change from baseline Vision Assessment?: No apparent visual deficits     Perception     Praxis Praxis Praxis tested?: Not tested    Pertinent Vitals/Pain Pain Assessment: Faces Pain Score: 8  Pain Location: back, BLE with mobility attempts Pain Descriptors / Indicators: Grimacing;Guarding;Moaning;Spasm Pain Intervention(s): Limited activity within patient's tolerance;Repositioned;Relaxation     Hand Dominance (pt reports using both)   Extremity/Trunk Assessment Upper Extremity Assessment Upper Extremity Assessment: Generalized weakness   Lower Extremity Assessment Lower Extremity Assessment: Defer to PT evaluation RLE Deficits / Details: ROM/mobility limited by pain and significant edema bilat LLE Deficits / Details: ROM/mobility limited by pain and significant edema bilat   Cervical / Trunk Assessment Cervical / Trunk  Assessment: Other exceptions Cervical / Trunk Exceptions: s/p PLIF   Communication Communication Communication: No difficulties   Cognition Arousal/Alertness: Awake/alert Behavior During Therapy: Flat affect Overall Cognitive Status: Difficult to  assess                                 General Comments: self limiting, giving very little effort   General Comments   pt reports even with blanket position can bother them or wind blowing on them    Exercises     Shoulder Instructions      Home Living Family/patient expects to be discharged to:: Private residence Living Arrangements: Parent;Other relatives Available Help at Discharge: Family;Available 24 hours/day Type of Home: Mobile home Home Access: Stairs to enter Entrance Stairs-Number of Steps: 6 Entrance Stairs-Rails: Right;Left Home Layout: One level         Biochemist, clinical: Standard Bathroom Accessibility: Yes How Accessible: Accessible via walker Home Equipment: [place] - 4 wheels;Bedside commode;Wheelchair - manual   Additional Comments: lift chair      Prior Functioning/Environment Level of Independence: Needs assistance  Gait / Transfers Assistance Needed: Pt sleeps in recliner/lift chair. Amb very short distances with 4-wheeled Up Walker. Wheelchair for primary mobility. Pt attempts to self propel. ADL's / Homemaking Assistance Needed: Family assists with all ADLs. She gets sponge bathed and uses BSC for toileting.   Comments: very limites in all activity        OT Problem List: Decreased strength;Decreased range of motion;Decreased activity tolerance;Decreased safety awareness;Impaired UE functional use;Pain      OT Treatment/Interventions: Self-care/ADL training;Therapeutic exercise;Therapeutic activities;Patient/family education;Balance training;DME and/or AE instruction;Energy conservation    OT Goals(Current goals can be found in the care plan section) Acute Rehab OT Goals Patient Stated Goal: decrease pain and edema OT Goal Formulation: With patient Time For Goal Achievement: 12/06/18 Potential to Achieve Goals: Good  OT Frequency: Min 2X/week   Barriers to D/C:            Co-evaluation              AM-PAC OT "6  Clicks" Daily Activity     Outcome Measure Help from another person eating meals?: A Little Help from another person taking care of personal grooming?: A Lot Help from another person toileting, which includes using toliet, bedpan, or urinal?: Total Help from another person bathing (including washing, rinsing, drying)?: Total Help from another person to put on and taking off regular upper body clothing?: Total Help from another person to put on and taking off regular lower body clothing?: Total 6 Click Score: 9   End of Session Nurse Communication: Mobility status  Activity Tolerance: Patient limited by pain Patient left:    OT Visit Diagnosis: Unsteadiness on feet (R26.81);Muscle weakness (generalized) (M62.81);Pain Pain - part of body: Leg(back )                Time: [id number] OT Time Calculation (min): 19 min Charges:  OT General Charges $OT Visit: 1 Visit OT Evaluation $OT Eval Low Complexity: Gandy OTR/L  Acute Rehab Services  [phone] office number [phone] pager number   Joeseph Amor 11/15/2018, 3:17 PM

## 2018-11-15 NOTE — Progress Notes (Signed)
Postop day 1.  Patient overall looks somewhat better today.  More awake oriented and appropriate.  Patient still complains of generalized pain predominantly her lower back but also in her lower abdomen.  Still with severe tightness and edema in both lower extremities.  No new complaints of radicular pain or weakness.  Patient is afebrile.  She is borderline hypotensive.  Drain output is moderate.  She is awake and alert.  She is oriented x3.  She is fluent.  Motor examination complicated by very poor effort in her lower extremities.  She does.  Moves all major motor groups on command albeit with pain and poor effort.  Dressing dry.  Abdomen soft.  Patient with multiple medical issues and I greatly appreciate Dr. Delsa Bern input last night.  I agree that she may have out-of-control hypothyroidism greatly complicating her current situation and her postoperative course.  Await current laboratory studies to provider determine treatment options.  Begin slow efforts at mobilization.  Patient likely looking at prolonged hospitalization and recovery.

## 2018-11-16 ENCOUNTER — Inpatient Hospital Stay (HOSPITAL_COMMUNITY): Payer: Medicare Other

## 2018-11-16 DIAGNOSIS — R9431 Abnormal electrocardiogram [ECG] [EKG]: Secondary | ICD-10-CM

## 2018-11-16 DIAGNOSIS — R0902 Hypoxemia: Secondary | ICD-10-CM

## 2018-11-16 LAB — CBC
HCT: 30.5 % — ABNORMAL LOW (ref 36.0–46.0)
Hemoglobin: 9.9 g/dL — ABNORMAL LOW (ref 12.0–15.0)
MCH: 31.5 pg (ref 26.0–34.0)
MCHC: 32.5 g/dL (ref 30.0–36.0)
MCV: 97.1 fL (ref 80.0–100.0)
Platelets: 316 10*3/uL (ref 150–400)
RBC: 3.14 MIL/uL — ABNORMAL LOW (ref 3.87–5.11)
RDW: 13.3 % (ref 11.5–15.5)
WBC: 10.2 10*3/uL (ref 4.0–10.5)
nRBC: 0 % (ref 0.0–0.2)

## 2018-11-16 LAB — ECHOCARDIOGRAM COMPLETE
Height: 65.5 in
Weight: 5136 oz

## 2018-11-16 LAB — COMPREHENSIVE METABOLIC PANEL
ALT: 15 U/L (ref 0–44)
AST: 27 U/L (ref 15–41)
Albumin: 3.2 g/dL — ABNORMAL LOW (ref 3.5–5.0)
Alkaline Phosphatase: 53 U/L (ref 38–126)
Anion gap: 10 (ref 5–15)
BUN: 6 mg/dL (ref 6–20)
CO2: 28 mmol/L (ref 22–32)
Calcium: 8.9 mg/dL (ref 8.9–10.3)
Chloride: 99 mmol/L (ref 98–111)
Creatinine, Ser: 0.55 mg/dL (ref 0.44–1.00)
GFR calc Af Amer: >60 mL/min (ref >60)
GFR calc non Af Amer: >60 mL/min (ref >60)
Glucose, Bld: 99 mg/dL (ref 70–99)
Potassium: 3.9 mmol/L (ref 3.5–5.1)
Sodium: 137 mmol/L (ref 135–145)
Total Bilirubin: 0.5 mg/dL (ref 0.3–1.2)
Total Protein: 6.3 g/dL — ABNORMAL LOW (ref 6.5–8.1)

## 2018-11-16 LAB — T3, FREE: T3, Free: 1.5 pg/mL — ABNORMAL LOW (ref 2.0–4.4)

## 2018-11-16 MED ORDER — FUROSEMIDE 10 MG/ML IJ SOLN
20.0000 mg | Freq: Four times a day (QID) | INTRAMUSCULAR | Status: AC
  Administered 2018-11-16 – 2018-11-17 (×4): 20 mg via INTRAVENOUS
  Filled 2018-11-16 (×4): qty 2

## 2018-11-16 MED ORDER — POTASSIUM CHLORIDE CRYS ER 20 MEQ PO TBCR
40.0000 meq | EXTENDED_RELEASE_TABLET | Freq: Once | ORAL | Status: AC
  Administered 2018-11-16: 40 meq via ORAL
  Filled 2018-11-16: qty 2

## 2018-11-16 NOTE — Progress Notes (Signed)
PROGRESS NOTE    Tonya Greene  [id number] DOB: 1970/04/25 DOA: 11/14/2018 PCP: Lin Landsman, MD    Brief Narrative:48 year old female with history of Graves' disease, thyroid cancer, hypertension, depression, arthritis CAD,, status post L4-L5 interbody fusion. When I saw her this morning she was sleeping but easily arousable.  She reported she has been having progressively worsening edema and weight gain for the last few months.  She has been to her PCP.  She has progressive worsening of shortness of breath, denies any chest pain during diarrhea. Today she complains of severe spasm in the left lower extremity.  11/16/2018 her main complaint is her left lower extremity spasm and pain and she is very tearful about it  Assessment & Plan:   Active Problems:   Degenerative spondylolisthesis   S/P lumbar fusion   Surgery, elective   S/P PICC central line placement   #1 anasarca unclear etiology patient has progressively worsening weight gain for the last few months.  Repeat TSH is 13.  Free T3 and T4 pending.  She is on Synthroid at home 137 MCG daily.  I will increase her Synthroid to 150 MCG daily.  Continue Lasix she has has had good urine output overnight she is negative at 11,000.  Echo done today shows normal ejection fraction.  UA does not show any evidence of proteinuria.  Chest x-ray  shows mild venous congestion.  Her edema is decreasing.  Addendum renal functions normal LFTs normal with albumin of 3.0.  CBC unremarkable.  Chest x-ray with fluid overload.    #2 status post decompression and fusion of L4-L5 secondary to degenerative spondylolisthesis.  #3 hypertension I will hold her Zestril and Procardia since her blood pressure is running soft and I still need to give her Lasix to control her edema.  #4 hypothyroidism on Synthroid 137 MCG at home.  TSH level is  13.7 T3 is 1.5 low and T4 is 1.11 which is normal.  Increase Synthroid to 150 MCG.  Follow-up levels in 6  weeks.    Estimated body mass index is 52.6 kg/m as calculated from the following:   Height as of this encounter: 5' 5.5" (1.664 m).   Weight as of this encounter: 145.6 kg.     Subjective: Patient resting in bed very tearful about the left lower extremity spasm and pain  Objective: Vitals:   11/16/18 0045 11/16/18 0412 11/16/18 0754 11/16/18 1259  BP: 130/68 (!) 127/59 120/65 118/69  Pulse: 92  88 90  Resp: 18 20 20 20   Temp: 99.1 F (37.3 C) 99.1 F (37.3 C) 98.5 F (36.9 C) 98.7 F (37.1 C)  TempSrc: Oral Oral Oral Oral  SpO2:   100% 100%  Weight:      Height:        Intake/Output Summary (Last 24 hours) at 11/16/2018 1403 Last data filed at 11/16/2018 1200 Gross per 24 hour  Intake 246 ml  Output 9295 ml  Net -9049 ml   Filed Weights   11/14/18 0635  Weight: (!) 145.6 kg    Examination:  General exam: Appears calm and comfortable  Respiratory system: Scattered rhonchi in both lung fieldsTo auscultation. Respiratory effort normal. Cardiovascular system: S1 & S2 heard, RRR. No JVD, murmurs, rubs, gallops or clicks. No pedal edema. Gastrointestinal system: Abdomen is nondistended, soft and nontender. No organomegaly or masses felt. Normal bowel sounds heard. Central nervous system: Alert and oriented. No focal neurological deficits. Extremities decreasing edema still with edema to upper thighs.. Skin:  No rashes, lesions or ulcers Psychiatry: Judgement and insight appear normal. Mood & affect appropriate.     Data Reviewed: I have personally reviewed following labs and imaging studies  CBC: Recent Labs  Lab 11/11/18 1502 11/15/18 0950 11/16/18 0831  WBC 8.2 10.9* 10.2  NEUTROABS 4.4 7.2  --   HGB 12.3 9.5* 9.9*  HCT 37.9 29.2* 30.5*  MCV 98.4 97.0 97.1  PLT 497* 319 [id number]   Basic Metabolic Panel: Recent Labs  Lab 11/11/18 1502 11/15/18 0950 11/16/18 0831  NA 139 137 137  K 4.5 4.3 3.9  CL 103 103 99  CO2 25 26 28   GLUCOSE 96 112* 99  BUN  9 8 6   CREATININE 0.71 0.68 0.55  CALCIUM 9.6 8.9 8.9  MG  --  2.0  --    GFR: Estimated Creatinine Clearance: 123.8 mL/min (by C-G formula based on SCr of 0.55 mg/dL). Liver Function Tests: Recent Labs  Lab 11/15/18 0950 11/16/18 0831  AST 32 27  ALT 13 15  ALKPHOS 49 53  BILITOT 0.4 0.5  PROT 5.9* 6.3*  ALBUMIN 3.0* 3.2*   No results for input(s): LIPASE, AMYLASE in the last 168 hours. No results for input(s): AMMONIA in the last 168 hours. Coagulation Profile: No results for input(s): INR, PROTIME in the last 168 hours. Cardiac Enzymes: No results for input(s): CKTOTAL, CKMB, CKMBINDEX, TROPONINI in the last 168 hours. BNP (last 3 results) No results for input(s): PROBNP in the last 8760 hours. HbA1C: No results for input(s): HGBA1C in the last 72 hours. CBG: No results for input(s): GLUCAP in the last 168 hours. Lipid Profile: No results for input(s): CHOL, HDL, LDLCALC, TRIG, CHOLHDL, LDLDIRECT in the last 72 hours. Thyroid Function Tests: Recent Labs    11/15/18 0950  TSH 13.702*  FREET4 1.11  T3FREE 1.5*   Anemia Panel: No results for input(s): VITAMINB12, FOLATE, FERRITIN, TIBC, IRON, RETICCTPCT in the last 72 hours. Sepsis Labs: No results for input(s): PROCALCITON, LATICACIDVEN in the last 168 hours.  Recent Results (from the past 240 hour(s))  Novel Coronavirus, NAA (Hosp order, Send-out to Ref Lab; TAT 18-24 hrs     Status: None   Collection Time: 11/11/18 12:34 PM   Specimen: Nasopharyngeal Swab; Respiratory  Result Value Ref Range Status   SARS-CoV-2, NAA NOT DETECTED NOT DETECTED Final    Comment: (NOTE) This nucleic acid amplification test was developed and its performance characteristics determined by [employer]. Nucleic acid amplification tests include PCR and TMA. This test has not been FDA cleared or approved. This test has been authorized by FDA under an Emergency Use Authorization (EUA). This test is only authorized for the  duration of time the declaration that circumstances exist justifying the authorization of the emergency use of in vitro diagnostic tests for detection of SARS-CoV-2 virus and/or diagnosis of COVID-19 infection under section 564(b)(1) of the Act, 21 U.S.C. [id number]) (1), unless the authorization is terminated or revoked sooner. When diagnostic testing is negative, the possibility of a false negative result should be considered in the context of a patient's recent exposures and the presence of clinical signs and symptoms consistent with COVID-19. An individual without symptoms of COVID- 19 and who is not shedding SARS-CoV-2 vi rus would expect to have a negative (not detected) result in this assay. Performed At: [facility] [address] [place], [place] [id number] Rush Farmer MD [id number]    [place]  Final    Comment: Performed at [facility]  Lab, [address], [place], [place] [address]  Surgical pcr screen     Status: None   Collection Time: 11/11/18  3:00 PM   Specimen: Nasal Mucosa; Nasal Swab  Result Value Ref Range Status   MRSA, PCR NEGATIVE NEGATIVE Final   Staphylococcus aureus NEGATIVE NEGATIVE Final    Comment: (NOTE) The Xpert SA Assay (FDA approved for NASAL specimens in patients 36 years of age and older), is one component of a comprehensive surveillance program. It is not intended to diagnose infection nor to guide or monitor treatment. Performed at [facility], [place] [address] [place], [place] [address]          Radiology Studies: Dg Chest Port 1 View  Result Date: 11/15/2018 CLINICAL DATA:  Reason for exam: hypoxemia Hx HTN EXAM: PORTABLE CHEST 1 VIEW COMPARISON:  11/14/2018 FINDINGS: Central venous line unchanged. Stable cardiac silhouette. Mild central venous congestion. No overt pulmonary edema. No pneumothorax. No consolidation. IMPRESSION: No interval change.  Mild venous congestion Electronically  Signed   By: Suzy Bouchard M.D.   On: 11/15/2018 11:06        Scheduled Meds: . Chlorhexidine Gluconate Cloth  6 each Topical Daily  . famotidine  20 mg Oral BID  . furosemide  20 mg Intravenous Q6H  . levothyroxine  150 mcg Oral Q0600  . loratadine  10 mg Oral Daily  . potassium chloride  20 mEq Oral BID  . sodium chloride flush  3 mL Intravenous Q12H   Continuous Infusions: . sodium chloride 250 mL (11/14/18 1618)     LOS: 2 days     Georgette Shell, MD [facility]  If 7PM-7AM, please contact night-coverage [email or web address] Password [email or web address] 11/16/2018, 2:03 PM

## 2018-11-16 NOTE — Progress Notes (Signed)
Patient ID: Tonya Greene, female   DOB: 24-Jul-1970, 48 y.o.   MRN: [id number] BP 120/65 (BP Location: Left Arm)   Pulse 88   Temp 98.5 F (36.9 C) (Oral)   Resp 20   Ht 5' 5.5" (1.664 m)   Wt (!) 145.6 kg   LMP 06/14/2018   SpO2 100%   BMI 52.60 kg/m  Alert, moving lower extremities Remains edematous, Dr. Zigmund Daniel has instituted her plan Neurological status is stable.

## 2018-11-16 NOTE — Progress Notes (Signed)
Physical Therapy Treatment Patient Details Name: Tonya Greene MRN: [id number] DOB: 10-13-1970 Today's Date: 11/16/2018    History of Present Illness 48 y.o. female, with past medical history significant for CAD, hypertension, depression, arthritis, Graves' disease, and hypothyroidism. She underwent PLIF 11/14/18.    PT Comments    Pt premedicated just prior to session. She was more engaged today with improved pain tolerance. Pt performed ankle pumps BLE AAROM x 20 reps. She required +2 max assist rolling in bed using rails. Unable to tolerate progression to EOB. Pt positioned with pillows in L sidelying.  Expect very slow mobility progression. If therapy participation and pain tolerance improves, pt may benefit from CIR consult. At current level, pt would be unable to tolerate the intensive program that CIR offers.   Follow Up Recommendations  SNF     Equipment Recommendations  None recommended by PT    Recommendations for Other Services       Precautions / Restrictions Precautions Precautions: Fall;Back Precaution Comments: reviewed 3/3 back precautions Required Braces or Orthoses: Spinal Brace Spinal Brace: Lumbar corset;Applied in sitting position Restrictions Other Position/Activity Restrictions: Brace is still not in room. RN notified. RN to contact ortho tech.    Mobility  Bed Mobility Overal bed mobility: Needs Assistance Bed Mobility: Rolling Rolling: +2 for physical assistance;Max assist         General bed mobility comments: rolling in supine with pt using rails to assist. Pt contributing < 25% of the work. Pt positioned with pillows in L sidelying.  Transfers                 General transfer comment: unable  Ambulation/Gait             General Gait Details: unable   Stairs             Wheelchair Mobility    Modified Rankin (Stroke Patients Only)       Balance                                             Cognition Arousal/Alertness: Awake/alert Behavior During Therapy: Anxious Overall Cognitive Status: Within Functional Limits for tasks assessed                                 General Comments: more engaged this session      Exercises General Exercises - Lower Extremity Ankle Circles/Pumps: AAROM;Right;Left;20 reps;Supine    General Comments General comments (skin integrity, edema, etc.): edema improving BLE distally, remains 3+ bilat thighs      Pertinent Vitals/Pain Pain Assessment: Faces Faces Pain Scale: Hurts worst Pain Location: back, BLE with mobility attempts Pain Descriptors / Indicators: Grimacing;Guarding;Moaning;Spasm;Crying Pain Intervention(s): Monitored during session;Repositioned;Limited activity within patient's tolerance;Relaxation;Premedicated before session    Home Living                      Prior Function            PT Goals (current goals can now be found in the care plan section) Acute Rehab PT Goals Patient Stated Goal: decrease pain and edema PT Goal Formulation: With patient Time For Goal Achievement: 11/29/18 Potential to Achieve Goals: Fair Progress towards PT goals: Progressing toward goals(slowly)    Frequency    Min 5X/week  PT Plan Current plan remains appropriate    Co-evaluation              AM-PAC PT "6 Clicks" Mobility   Outcome Measure  Help needed turning from your back to your side while in a flat bed without using bedrails?: Total Help needed moving from lying on your back to sitting on the side of a flat bed without using bedrails?: Total Help needed moving to and from a bed to a chair (including a wheelchair)?: Total Help needed standing up from a chair using your arms (e.g., wheelchair or bedside chair)?: Total Help needed to walk in hospital room?: Total Help needed climbing 3-5 steps with a railing? : Total 6 Click Score: 6    End of Session Equipment Utilized During  Treatment: Oxygen Activity Tolerance: Patient limited by pain;Other (comment)(anxiety/self limiting) Patient left: in bed;with call bell/phone within reach Nurse Communication: Mobility status PT Visit Diagnosis: Other abnormalities of gait and mobility (R26.89);Muscle weakness (generalized) (M62.81);Pain Pain - Right/Left: Left Pain - part of body: Leg     Time: 0901-0919 PT Time Calculation (min) (ACUTE ONLY): 18 min  Charges:  $Therapeutic Activity: 8-22 mins                     Lorrin Goodell, PT  Office # [phone] Pager [phone]    Lorriane Shire 11/16/2018, 10:57 AM

## 2018-11-16 NOTE — Progress Notes (Signed)
  Echocardiogram 2D Echocardiogram has been performed.  Tonya Greene 11/16/2018, 12:12 PM

## 2018-11-17 LAB — BASIC METABOLIC PANEL
Anion gap: 10 (ref 5–15)
BUN: 7 mg/dL (ref 6–20)
CO2: 27 mmol/L (ref 22–32)
Calcium: 8.6 mg/dL — ABNORMAL LOW (ref 8.9–10.3)
Chloride: 96 mmol/L — ABNORMAL LOW (ref 98–111)
Creatinine, Ser: 0.62 mg/dL (ref 0.44–1.00)
GFR calc Af Amer: >60 mL/min (ref >60)
GFR calc non Af Amer: >60 mL/min (ref >60)
Glucose, Bld: 108 mg/dL — ABNORMAL HIGH (ref 70–99)
Potassium: 3.8 mmol/L (ref 3.5–5.1)
Sodium: 133 mmol/L — ABNORMAL LOW (ref 135–145)

## 2018-11-17 MED ORDER — ZOLPIDEM TARTRATE 5 MG PO TABS
5.0000 mg | ORAL_TABLET | Freq: Every evening | ORAL | Status: DC | PRN
  Administered 2018-11-17: 5 mg via ORAL
  Filled 2018-11-17: qty 1

## 2018-11-17 MED ORDER — POTASSIUM CHLORIDE CRYS ER 20 MEQ PO TBCR
20.0000 meq | EXTENDED_RELEASE_TABLET | Freq: Two times a day (BID) | ORAL | Status: AC
  Administered 2018-11-17 – 2018-11-19 (×4): 20 meq via ORAL
  Filled 2018-11-17 (×4): qty 1

## 2018-11-17 MED ORDER — FUROSEMIDE 10 MG/ML IJ SOLN
20.0000 mg | Freq: Four times a day (QID) | INTRAMUSCULAR | Status: AC
  Administered 2018-11-17 – 2018-11-18 (×4): 20 mg via INTRAVENOUS
  Filled 2018-11-17 (×4): qty 2

## 2018-11-17 NOTE — Progress Notes (Signed)
Postop day 3.  Patient overall slowly improving.  Her back pain still is considerable.  She still has spasms in the both remaining.  She still has some dysesthesia  She is awake and alert.  She is oriented and appropriate.  Motor examination without gross deficit.  Sensory examination stable.  Wound clean and dry.  Drain output lessening.  Some dryness.  Overall progressing slowly but steadily.  Preoperative severe pain appears somewhat improved.  Continue mobilizing with therapy.  Medical issues including severe hypothyroidism under management.

## 2018-11-17 NOTE — Progress Notes (Signed)
PROGRESS NOTE    Tonya Greene  [id number] DOB: 01-07-1971 DOA: 11/14/2018 PCP: Tonya Landsman, MD   Brief Narrative:48 year old female with history of Graves' disease, thyroid cancer, hypertension, depression, arthritis CAD,, status post L4-L5 interbody fusion. When I saw her this morning she was sleeping but easily arousable. She reported she has been having progressively worsening edema and weight gain for the last few months. She has been to her PCP. She has progressive worsening of shortness of breath, denies any chest pain during diarrhea. Today she complains of severe spasm in the left lower extremity.  11/16/2018 her main complaint is her left lower extremity spasm and pain and she is very tearful about it  11/17/2018 patient awake and alert sitting up in bed trying to eat breakfast more awake alert and comfortable compared to yesterday.  Still complains of a lot of pain in the lower extremity but at least she was able to allow me to touch her legs today  Assessment & Plan:   Active Problems:   Degenerative spondylolisthesis   S/P lumbar fusion   Surgery, elective   S/P PICC central line placement   Hypoxemia   #1 anasarca with fluid overload over the course of few months prior to admission to hospital.  Work-up revealed normal ejection fraction UA with no proteinuria chest x-ray with mild vascular congestion.  TSH was 13 which is not enough to cause kind of fluid overload.  I will continue Lasix she is negative by 15,000 cc.   #2 status post decompression fusion of L4-L5 per neurosurgery.  #3 hypertension blood pressure today is 119/77 continue to hold Zestril and Procardia and continue Lasix cautiously.  #4 hypothyroidism continue Synthroid at 150 cc.   Estimated body mass index is 52.6 kg/m as calculated from the following:   Height as of this encounter: 5' 5.5" (1.664 m).   Weight as of this encounter: 145.6 kg.  DVT prophylaxis:scd  Code Status: Full code  Family Communication: None   Subjective: Resting in bed sitting up in bed trying to have breakfast more awake alert pain better however not back to her baseline  Objective: Vitals:   11/16/18 1938 11/16/18 2317 11/17/18 0320 11/17/18 0730  BP: 110/62 117/69 (!) 141/79 119/77  Pulse: 91 82 94 83  Resp: 18 17 18    Temp: 98.2 F (36.8 C) 98.3 F (36.8 C) 98.6 F (37 C) 98.8 F (37.1 C)  TempSrc: Oral Oral Oral Oral  SpO2: 100% 99% 100% 100%  Weight:      Height:        Intake/Output Summary (Last 24 hours) at 11/17/2018 1600 Last data filed at 11/17/2018 1500 Gross per 24 hour  Intake 723 ml  Output 3700 ml  Net -2977 ml   Filed Weights   11/14/18 0635  Weight: (!) 145.6 kg    Examination:  General exam: Appears calm and comfortable  Respiratory system decreased breath sounds at the bases to auscultation. Respiratory effort normal. Cardiovascular system: S1 & S2 heard, RRR. No JVD, murmurs, rubs, gallops or clicks. No pedal edema. Gastrointestinal system: Abdomen is nondistended, soft and nontender. No organomegaly or masses felt. Normal bowel sounds heard. Central nervous system: Alert and oriented. No focal neurological deficits. Extremities: 3+ pitting edema about the knees skin: No rashes, lesions or ulcers Psychiatry: Judgement and insight appear normal. Mood & affect appropriate.     Data Reviewed: I have personally reviewed following labs and imaging studies  CBC: Recent Labs  Lab 11/11/18 1502  11/15/18 0950 11/16/18 0831  WBC 8.2 10.9* 10.2  NEUTROABS 4.4 7.2  --   HGB 12.3 9.5* 9.9*  HCT 37.9 29.2* 30.5*  MCV 98.4 97.0 97.1  PLT 497* 319 [id number]   Basic Metabolic Panel: Recent Labs  Lab 11/11/18 1502 11/15/18 0950 11/16/18 0831 11/17/18 0500  NA 139 137 137 133*  K 4.5 4.3 3.9 3.8  CL 103 103 99 96*  CO2 25 26 28 27   GLUCOSE 96 112* 99 108*  BUN 9 8 6 7   CREATININE 0.71 0.68 0.55 0.62  CALCIUM 9.6 8.9 8.9 8.6*  MG  --  2.0  --   --     GFR: Estimated Creatinine Clearance: 123.8 mL/min (by C-G formula based on SCr of 0.62 mg/dL). Liver Function Tests: Recent Labs  Lab 11/15/18 0950 11/16/18 0831  AST 32 27  ALT 13 15  ALKPHOS 49 53  BILITOT 0.4 0.5  PROT 5.9* 6.3*  ALBUMIN 3.0* 3.2*   No results for input(s): LIPASE, AMYLASE in the last 168 hours. No results for input(s): AMMONIA in the last 168 hours. Coagulation Profile: No results for input(s): INR, PROTIME in the last 168 hours. Cardiac Enzymes: No results for input(s): CKTOTAL, CKMB, CKMBINDEX, TROPONINI in the last 168 hours. BNP (last 3 results) No results for input(s): PROBNP in the last 8760 hours. HbA1C: No results for input(s): HGBA1C in the last 72 hours. CBG: No results for input(s): GLUCAP in the last 168 hours. Lipid Profile: No results for input(s): CHOL, HDL, LDLCALC, TRIG, CHOLHDL, LDLDIRECT in the last 72 hours. Thyroid Function Tests: Recent Labs    11/15/18 0950  TSH 13.702*  FREET4 1.11  T3FREE 1.5*   Anemia Panel: No results for input(s): VITAMINB12, FOLATE, FERRITIN, TIBC, IRON, RETICCTPCT in the last 72 hours. Sepsis Labs: No results for input(s): PROCALCITON, LATICACIDVEN in the last 168 hours.  Recent Results (from the past 240 hour(s))  Novel Coronavirus, NAA (Hosp order, Send-out to Ref Lab; TAT 18-24 hrs     Status: None   Collection Time: 11/11/18 12:34 PM   Specimen: Nasopharyngeal Swab; Respiratory  Result Value Ref Range Status   SARS-CoV-2, NAA NOT DETECTED NOT DETECTED Final    Comment: (NOTE) This nucleic acid amplification test was developed and its performance characteristics determined by [employer]. Nucleic acid amplification tests include PCR and TMA. This test has not been FDA cleared or approved. This test has been authorized by FDA under an Emergency Use Authorization (EUA). This test is only authorized for the duration of time the declaration that circumstances exist justifying the  authorization of the emergency use of in vitro diagnostic tests for detection of SARS-CoV-2 virus and/or diagnosis of COVID-19 infection under section 564(b)(1) of the Act, 21 U.S.C. [id number]) (1), unless the authorization is terminated or revoked sooner. When diagnostic testing is negative, the possibility of a false negative result should be considered in the context of a patient's recent exposures and the presence of clinical signs and symptoms consistent with COVID-19. An individual without symptoms of COVID- 19 and who is not shedding SARS-CoV-2 vi rus would expect to have a negative (not detected) result in this assay. Performed At: [facility] [address] [place], [place] [id number] Rush Farmer MD [id number]    [place]  Final    Comment: Performed at [facility], [place] [address] [place], [place] [address]  Surgical pcr screen     Status: None   Collection Time: 11/11/18  3:00  PM   Specimen: Nasal Mucosa; Nasal Swab  Result Value Ref Range Status   MRSA, PCR NEGATIVE NEGATIVE Final   Staphylococcus aureus NEGATIVE NEGATIVE Final    Comment: (NOTE) The Xpert SA Assay (FDA approved for NASAL specimens in patients 23 years of age and older), is one component of a comprehensive surveillance program. It is not intended to diagnose infection nor to guide or monitor treatment. Performed at [facility], [place] [address], [place], [place] [address]          Radiology Studies: No results found.      Scheduled Meds: . Chlorhexidine Gluconate Cloth  6 each Topical Daily  . famotidine  20 mg Oral BID  . levothyroxine  150 mcg Oral Q0600  . loratadine  10 mg Oral Daily  . sodium chloride flush  3 mL Intravenous Q12H   Continuous Infusions: . sodium chloride 250 mL (11/14/18 1618)     LOS: 3 days     Georgette Shell, MD [facility]  If 7PM-7AM, please contact night-coverage [email or web address]  Password [email or web address] 11/17/2018, 4:00 PM

## 2018-11-17 NOTE — Progress Notes (Signed)
The chaplain visited with the patient after a referral from the nurse during rounding.  The chaplain provided emotional and spiritual support while listening empathetically to the patient.  The patient expressed some anxiety about the healing process and all of the health concerns.  The chaplain will visit as requested by the patient.  Brion Aliment Chaplain Resident For questions concerning this note please contact me by pager [phone]

## 2018-11-17 NOTE — Progress Notes (Signed)
Physical Therapy Treatment Patient Details Name: Tonya Greene MRN: [id number] DOB: [birth year] Today's Date: 11/17/2018    History of Present Illness 48 y.o. female, with past medical history significant for CAD, hypertension, depression, arthritis, Graves' disease, and hypothyroidism. She underwent PLIF 11/14/18.    PT Comments    Pt continues to report excruciating pain with mobility but also demonstrates anxiety that is self limiting in performing mobility. Pt educated on back precautions again and importance of OOB mobility, pt maxAX2 for mobility and modAx2 with std pvt to chair. Cont to recommend SNF upon d/c for increased recovery time.  Follow Up Recommendations  SNF     Equipment Recommendations  None recommended by PT    Recommendations for Other Services       Precautions / Restrictions Precautions Precautions: Fall;Back Precaution Comments: reviewed 3/3 back precautions Required Braces or Orthoses: Spinal Brace Spinal Brace: Lumbar corset;Applied in sitting position Restrictions Weight Bearing Restrictions: No Other Position/Activity Restrictions: Brace in room    Mobility  Bed Mobility Overal bed mobility: Needs Assistance Bed Mobility: Rolling;Sidelying to Sit Rolling: Max assist;+2 for physical assistance;+2 for safety/equipment Sidelying to sit: Mod assist;+2 for physical assistance;+2 for safety/equipment       General bed mobility comments: Pt required mod A-max A +2, required max encouragement  Transfers Overall transfer level: Needs assistance Equipment used: Rolling walker (2 wheeled);2 person hand held assist Transfers: Sit to/from [facility] Sit to Stand: Max assist;+2 physical assistance;+2 safety/equipment Stand pivot transfers: +2 physical assistance;Min assist       General transfer comment: maxA to power up into standing and achieve full upright position, max verbal cues for stant pvt, increased time, antalgic gait  pattern  Ambulation/Gait             General Gait Details: limited to std pvt to chair   Stairs             Wheelchair Mobility    Modified Rankin (Stroke Patients Only)       Balance Overall balance assessment: Needs assistance Sitting-balance support: Bilateral upper extremity supported;Feet supported Sitting balance-Leahy Scale: Fair   Postural control: Right lateral lean;Posterior lean Standing balance support: Bilateral upper extremity supported;During functional activity Standing balance-Leahy Scale: Poor Standing balance comment: required mod A +2 and BUE support on RW to maintain static standing                            Cognition Arousal/Alertness: Awake/alert Behavior During Therapy: Anxious Overall Cognitive Status: Within Functional Limits for tasks assessed                                 General Comments: pt very self limiting, in tears, very anxious, requiring max verbal cues for deep breathing and calm down, education on benefits of moving      Exercises      General Comments General comments (skin integrity, edema, etc.): pt with hemovac drain intact      Pertinent Vitals/Pain Pain Assessment: Faces Faces Pain Scale: Hurts worst Pain Location: LLE, back Pain Descriptors / Indicators: Aching;Constant;Discomfort;Grimacing;Guarding;Moaning;Crying;Spasm;Sore;Squeezing(crying) Pain Intervention(s): Monitored during session    Home Living                      Prior Function            PT Goals (current goals can now be  found in the care plan section) Acute Rehab PT Goals Patient Stated Goal: decrease pain and edema Progress towards PT goals: Progressing toward goals    Frequency    Min 5X/week      PT Plan Current plan remains appropriate    Co-evaluation PT/OT/SLP Co-Evaluation/Treatment: Yes Reason for Co-Treatment: For patient/therapist safety PT goals addressed during session:  Mobility/safety with mobility        AM-PAC PT "6 Clicks" Mobility   Outcome Measure  Help needed turning from your back to your side while in a flat bed without using bedrails?: A Lot Help needed moving from lying on your back to sitting on the side of a flat bed without using bedrails?: A Lot Help needed moving to and from a bed to a chair (including a wheelchair)?: A Lot Help needed standing up from a chair using your arms (e.g., wheelchair or bedside chair)?: A Lot Help needed to walk in hospital room?: A Lot Help needed climbing 3-5 steps with a railing? : Total 6 Click Score: 11    End of Session   Activity Tolerance: Patient limited by pain;Other (comment)(anxiety/self limiting) Patient left: with call bell/phone within reach;in chair(with OT) Nurse Communication: Mobility status PT Visit Diagnosis: Other abnormalities of gait and mobility (R26.89);Muscle weakness (generalized) (M62.81);Pain Pain - Right/Left: Left Pain - part of body: Leg     Time: [id number] PT Time Calculation (min) (ACUTE ONLY): 37 min  Charges:  $Gait Training: 8-22 mins $Therapeutic Activity: 8-22 mins                    Kittie Plater, PT, DPT Acute Rehabilitation Services Pager #: [phone] Office #: [phone]    [place]line Lopes 11/17/2018, 5:14 PM

## 2018-11-17 NOTE — NC FL2 (Signed)
[place] LEVEL OF CARE SCREENING TOOL     IDENTIFICATION  Patient Name: Tonya Greene Birthdate: [birth year] Sex: female Admission Date (Current Location): 11/14/2018  [place] and [place] Number:  Tonya Greene [id number] [place] and Address:  [facility]. [facility], [place] [address], [place], [place] [address]      Provider Number: [id number]  Attending Physician Name and Address:  Earnie Larsson, MD  Relative Name and Phone Number:       Current Level of Care: Hospital Recommended Level of Care: [place] Prior Approval Number:    Date Approved/Denied:   PASRR Number: [id number]  Discharge Plan: SNF    Current Diagnoses: Patient Active Problem List   Diagnosis Date Noted  . Hypoxemia   . Surgery, elective   . S/P PICC central line placement   . Degenerative spondylolisthesis 11/14/2018  . S/P lumbar fusion 11/14/2018  . 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 GERD (gastroesophageal reflux disease) 07/24/2016  . Ganglion cyst of right foot 04/12/2015  . Graves disease 10/29/2013  . Benign essential HTN 10/29/2013  . Lumbar disc disease 10/29/2013  . Morbid obesity with BMI of 45.0-49.9, adult ([place]) 10/29/2013    Orientation RESPIRATION BLADDER Height & Weight     Time, Self, Situation, Place  O2(3L nasal canula) Incontinent, Indwelling catheter Weight: (!) 321 lb (145.6 kg) Height:  5' 5.5" (166.4 cm)  BEHAVIORAL SYMPTOMS/MOOD NEUROLOGICAL BOWEL NUTRITION STATUS      Continent Diet(regular diet; thin liquids)  AMBULATORY STATUS COMMUNICATION OF NEEDS Skin   Extensive Assist Verbally Surgical wounds(incision on back with honeycomb dressing to be changed PRN)                       Personal Care Assistance Level of Assistance  Bathing, Feeding, Dressing Bathing Assistance: Maximum assistance Feeding assistance: Independent Dressing Assistance: Maximum assistance     Functional Limitations Info  Sight, Hearing,  Speech Sight Info: Adequate Hearing Info: Adequate Speech Info: Adequate    SPECIAL CARE FACTORS FREQUENCY  OT (By licensed OT), PT (By licensed PT)     PT Frequency: 5x week OT Frequency: 5x week            Contractures Contractures Info: Not present    Additional Factors Info  Code Status, Allergies Code Status Info: Full Code Allergies Info: Tape           Current Medications (11/17/2018):  This is the current hospital active medication list Current Facility-Administered Medications  Medication Dose Route Frequency Provider Last Rate Last Dose  . 0.9 %  sodium chloride infusion  250 mL Intravenous Continuous Earnie Larsson, MD 1 mL/hr at 11/14/18 1618 250 mL at 11/14/18 1618  . acetaminophen (TYLENOL) tablet 650 mg  650 mg Oral Q4H PRN Earnie Larsson, MD       Or  . acetaminophen (TYLENOL) suppository 650 mg  650 mg Rectal Q4H PRN Earnie Larsson, MD      . albuterol (PROVENTIL) (2.5 MG/3ML) 0.083% nebulizer solution 3 mL  3 mL Inhalation Q6H PRN Earnie Larsson, MD      . bisacodyl (DULCOLAX) suppository 10 mg  10 mg Rectal Daily PRN Earnie Larsson, MD      . Chlorhexidine Gluconate Cloth 2 % PADS 6 each  6 each Topical Daily Earnie Larsson, MD   6 each at 11/17/18 0945  . diazepam (VALIUM) tablet 5-10 mg  5-10 mg Oral Q6H PRN Earnie Larsson, MD   10 mg at 11/16/18  2134  . famotidine (PEPCID) tablet 20 mg  20 mg Oral BID Earnie Larsson, MD   20 mg at 11/17/18 0944  . HYDROcodone-acetaminophen (NORCO) 10-325 MG per tablet 1 tablet  1 tablet Oral Q4H PRN Earnie Larsson, MD   1 tablet at 11/16/18 2018  . HYDROmorphone (DILAUDID) injection 1 mg  1 mg Intravenous Q2H PRN Earnie Larsson, MD   1 mg at 11/16/18 2019  . levothyroxine (SYNTHROID) tablet 150 mcg  150 mcg Oral Q0600 Georgette Shell, MD   150 mcg at 11/17/18 0604  . loratadine (CLARITIN) tablet 10 mg  10 mg Oral Daily Earnie Larsson, MD   10 mg at 11/17/18 0945  . menthol-cetylpyridinium (CEPACOL) lozenge 3 mg  1 lozenge Oral PRN Earnie Larsson, MD        Or  . phenol (CHLORASEPTIC) mouth spray 1 spray  1 spray Mouth/Throat PRN Earnie Larsson, MD      . ondansetron [facility]) tablet 4 mg  4 mg Oral Q6H PRN Earnie Larsson, MD       Or  . ondansetron (ZOFRAN) injection 4 mg  4 mg Intravenous Q6H PRN Earnie Larsson, MD      . oxyCODONE (Oxy IR/ROXICODONE) immediate release tablet 10 mg  10 mg Oral Q3H PRN Earnie Larsson, MD   10 mg at 11/17/18 0944  . polyethylene glycol (MIRALAX / GLYCOLAX) packet 17 g  17 g Oral Daily PRN Earnie Larsson, MD      . promethazine (PHENERGAN) 6.25 MG/5ML syrup 12.5 mg  12.5 mg Oral TID PRN Earnie Larsson, MD      . sodium chloride flush (NS) 0.9 % injection 3 mL  3 mL Intravenous Q12H Earnie Larsson, MD   3 mL at 11/17/18 0945  . sodium chloride flush (NS) 0.9 % injection 3 mL  3 mL Intravenous PRN Earnie Larsson, MD      . sodium phosphate (FLEET) 7-19 GM/118ML enema 1 enema  1 enema Rectal Once PRN Earnie Larsson, MD         Discharge Medications: Please see discharge summary for a list of discharge medications.  Relevant Imaging Results:  Relevant Lab Results:   Additional Information SS#[id number] [place], [place]

## 2018-11-17 NOTE — Progress Notes (Signed)
Occupational Therapy Treatment Patient Details Name: NIKOL SHERBA MRN: [id number] DOB: 19-Sep-1970 Today's Date: 11/17/2018    History of present illness 48 y.o. female, with past medical history significant for CAD, hypertension, depression, arthritis, Graves' disease, and hypothyroidism. She underwent PLIF 11/14/18.   OT comments  Pt continues to progress towards OT goals. Pt currently presents with poor strength, balance, anxiety, and severe pain. Upon arrival, pt became very emotional, crying about the pain in her LLE. Pt required cues for breathing and calming self prior to engaging in task. Pt required total A for donning socks sitting EOB. Pt required max A +2 to power up into standing, and mod A +2 to take small steps towards recliner. Continue to recommend dc to SNF to address deficits. Will continue to follow acutely as admitted.    Follow Up Recommendations  SNF;Supervision/Assistance - 24 hour    Equipment Recommendations  Other (comment)(defer to next venue)    Recommendations for Other Services PT consult    Precautions / Restrictions Precautions Precautions: Fall;Back Precaution Comments: reviewed 3/3 back precautions Required Braces or Orthoses: Spinal Brace Spinal Brace: Lumbar corset;Applied in sitting position Restrictions Weight Bearing Restrictions: No Other Position/Activity Restrictions: Brace in room       Mobility Bed Mobility Overal bed mobility: Needs Assistance Bed Mobility: Rolling;Sidelying to Sit Rolling: Max assist;+2 for physical assistance;+2 for safety/equipment Sidelying to sit: Mod assist;+2 for physical assistance;+2 for safety/equipment       General bed mobility comments: Pt required mod A-max A +2, required max encouragement  Transfers Overall transfer level: Needs assistance Equipment used: Rolling walker (2 wheeled);2 person hand held assist Transfers: Sit to/from Stand Sit to Stand: Max assist;+2 physical assistance;+2  safety/equipment         General transfer comment: required max +2 with bilateral foot block anduse of pad to power up to stand    Balance Overall balance assessment: Needs assistance Sitting-balance support: Bilateral upper extremity supported;Feet supported Sitting balance-Leahy Scale: Fair   Postural control: Right lateral lean;Posterior lean Standing balance support: Bilateral upper extremity supported;During functional activity Standing balance-Leahy Scale: Poor Standing balance comment: required mod A +2 and BUE support on RW to maintain static standing                           ADL either performed or assessed with clinical judgement   ADL Overall ADL's : Needs assistance/impaired Eating/Feeding: Modified independent;Bed level Eating/Feeding Details (indicate cue type and reason): pt drinking coffee in bed upon arrival                 Lower Body Dressing: Total assistance;Sitting/lateral leans Lower Body Dressing Details (indicate cue type and reason): required total A to don socks sitting EOB Toilet Transfer: +2 for physical assistance;+2 for safety/equipment;Ambulation;RW;Maximal assistance(simulated to recliner) Toilet Transfer Details (indicate cue type and reason): Pt required max A +2 with bilateral foot block, use of pad, and VCs/tactile cues for hand placement on RW         Functional mobility during ADLs: Moderate assistance;+2 for physical assistance;+2 for safety/equipment;Rolling walker General ADL Comments: Pt behavior limiting participation in ADLs. Required max A +2 to power up to stand, mod A +2 for functional mobility     Vision       Perception     Praxis      Cognition Arousal/Alertness: Awake/alert Behavior During Therapy: Anxious Overall Cognitive Status: Within Functional Limits for tasks assessed  General Comments: Pt anticipating pain, self limiting and gave little effort to  participate. Very emotional throughout session. Benefitted from encouragement        Exercises     Shoulder Instructions       General Comments Pt had moderate edema of BLE    Pertinent Vitals/ Pain       Pain Assessment: Faces Faces Pain Scale: Hurts worst Pain Location: LLE, back Pain Descriptors / Indicators: Aching;Constant;Discomfort;Grimacing;Guarding;Moaning;Crying;Spasm;Sore;Squeezing Pain Intervention(s): Monitored during session;Limited activity within patient's tolerance;RN gave pain meds during session;Repositioned;Relaxation;Utilized relaxation techniques  Home Living                                          Prior Functioning/Environment              Frequency  Min 2X/week        Progress Toward Goals  OT Goals(current goals can now be found in the care plan section)  Progress towards OT goals: Progressing toward goals  Acute Rehab OT Goals Patient Stated Goal: decrease pain and edema OT Goal Formulation: With patient Time For Goal Achievement: 12/06/18 Potential to Achieve Goals: Good ADL Goals Pt Will Perform Grooming: with set-up;bed level Pt Will Perform Upper Body Dressing: with min guard assist;bed level Pt Will Transfer to Toilet: with mod assist;with +2 assist;bedside commode  Plan Discharge plan remains appropriate;Frequency remains appropriate    Co-evaluation    PT/OT/SLP Co-Evaluation/Treatment: Yes Reason for Co-Treatment: For patient/therapist safety PT goals addressed during session: Mobility/safety with mobility OT goals addressed during session: ADL's and self-care      AM-PAC OT "6 Clicks" Daily Activity     Outcome Measure   Help from another person eating meals?: None Help from another person taking care of personal grooming?: A Lot Help from another person toileting, which includes using toliet, bedpan, or urinal?: Total Help from another person bathing (including washing, rinsing, drying)?:  Total Help from another person to put on and taking off regular upper body clothing?: A Lot Help from another person to put on and taking off regular lower body clothing?: Total 6 Click Score: 11    End of Session Equipment Utilized During Treatment: Rolling walker;Gait belt  OT Visit Diagnosis: Unsteadiness on feet (R26.81);Muscle weakness (generalized) (M62.81);Pain Pain - Right/Left: Left Pain - part of body: Leg   Activity Tolerance Patient limited by pain   Patient Left in chair;with call bell/phone within reach;with chair alarm set   Nurse Communication Mobility status        Time: 8295-6213 OT Time Calculation (min): 40 min  Charges: OT General Charges $OT Visit: 1 Visit OT Treatments $Self Care/Home Management : 8-22 mins $Therapeutic Activity: 8-22 mins  Sandrea Hammond, OT Student  Mardene Celeste Safir Michalec 11/17/2018, 11:12 AM

## 2018-11-18 LAB — BASIC METABOLIC PANEL
Anion gap: 10 (ref 5–15)
BUN: 6 mg/dL (ref 6–20)
CO2: 28 mmol/L (ref 22–32)
Calcium: 9.2 mg/dL (ref 8.9–10.3)
Chloride: 98 mmol/L (ref 98–111)
Creatinine, Ser: 0.52 mg/dL (ref 0.44–1.00)
GFR calc Af Amer: >60 mL/min (ref >60)
GFR calc non Af Amer: >60 mL/min (ref >60)
Glucose, Bld: 100 mg/dL — ABNORMAL HIGH (ref 70–99)
Potassium: 4.2 mmol/L (ref 3.5–5.1)
Sodium: 136 mmol/L (ref 135–145)

## 2018-11-18 MED ORDER — FUROSEMIDE 10 MG/ML IJ SOLN
20.0000 mg | Freq: Four times a day (QID) | INTRAMUSCULAR | Status: AC
  Administered 2018-11-18 – 2018-11-19 (×3): 20 mg via INTRAVENOUS
  Filled 2018-11-18 (×4): qty 2

## 2018-11-18 MED FILL — Heparin Sodium (Porcine) Inj 1000 Unit/ML: INTRAMUSCULAR | Qty: 30 | Status: AC

## 2018-11-18 MED FILL — Sodium Chloride IV Soln 0.9%: INTRAVENOUS | Qty: 2000 | Status: AC

## 2018-11-18 NOTE — Progress Notes (Signed)
Physical Therapy Treatment Patient Details Name: Tonya Greene MRN: [id number] DOB: 01-06-1971 Today's Date: 11/18/2018    History of Present Illness 48 y.o. female, with past medical history significant for CAD, hypertension, depression, arthritis, Graves' disease, and hypothyroidism. She underwent PLIF 11/14/18.    PT Comments    Patient with better pain control and able to mobilize with much less assistance today. Stood from recliner with +2 min assist to RW and walked 2 feet, then chair brought up behind her to sit. Mother present and discussed discharge plan (pt now refusing SNF). Mother reports they can provide the care she needs at home. Educated both pt and mother on back precautions. Discussed technique for in/out of bed (pt reports she may want to sleep on her tempurpedic bed instead of her lift/recliner chair.    Follow Up Recommendations  Home health PT     Equipment Recommendations  Other (comment)(Bariatric BSC; bariatric RW (pt & mother report that home equipment was bought for mother after her knee surgery and pt has not had bariatric BSC or RW purchased previously by her insurance)    Recommendations for Other Services       Precautions / Restrictions Precautions Precautions: Fall;Back Precaution Comments: reviewed 3/3 back precautions; with max cues pt able to state 1 of 3 Required Braces or Orthoses: Spinal Brace Spinal Brace: Lumbar corset;Applied in sitting position(pt up in chair without brace on arrival) Restrictions Weight Bearing Restrictions: No Other Position/Activity Restrictions: Brace in room    Mobility  Bed Mobility               General bed mobility comments: up in chair (assisted by nursing)  Transfers Overall transfer level: Needs assistance Equipment used: Rolling walker (2 wheeled);2 person hand held assist Transfers: Sit to/from Stand;Stand Pivot Transfers Sit to Stand: +2 physical assistance;Min assist;+2 safety/equipment          General transfer comment: pt rocks and uses momentum to come to stand; assist for greater anterior wt-shift over her feet and then come to stand  Ambulation/Gait Ambulation/Gait assistance: Min guard;+2 safety/equipment Gait Distance (Feet): 2 Feet Assistive device: Rolling walker (2 wheeled) Gait Pattern/deviations: Step-to pattern;Decreased stride length;Wide base of support     General Gait Details: limited to std pvt to chair   Stairs             Wheelchair Mobility    Modified Rankin (Stroke Patients Only)       Balance Overall balance assessment: Needs assistance Sitting-balance support: Bilateral upper extremity supported;Feet supported Sitting balance-Leahy Scale: Fair     Standing balance support: Bilateral upper extremity supported;During functional activity Standing balance-Leahy Scale: Poor Standing balance comment: light support thru UEs on RW; stood x 2 mintues                            Cognition Arousal/Alertness: Lethargic;Suspect due to medications Behavior During Therapy: [facility] for tasks assessed/performed Overall Cognitive Status: Within Functional Limits for tasks assessed                                        Exercises      General Comments General comments (skin integrity, edema, etc.): Mother present and reports she is a CNA and two other female family members are CNAs and can assist pt      Pertinent Vitals/Pain Pain Assessment:  Faces Faces Pain Scale: Hurts a little bit Pain Location:  back Pain Descriptors / Indicators: Aching;Constant;Discomfort(crying) Pain Intervention(s): Limited activity within patient's tolerance;Premedicated before session;Monitored during session;Repositioned    Home Living                      Prior Function            PT Goals (current goals can now be found in the care plan section) Acute Rehab PT Goals Patient Stated Goal: decrease pain and edema Time  For Goal Achievement: 11/29/18 Potential to Achieve Goals: Fair Progress towards PT goals: Progressing toward goals    Frequency    Min 5X/week      PT Plan Discharge plan needs to be updated    Co-evaluation              AM-PAC PT "6 Clicks" Mobility   Outcome Measure  Help needed turning from your back to your side while in a flat bed without using bedrails?: A Lot Help needed moving from lying on your back to sitting on the side of a flat bed without using bedrails?: A Lot Help needed moving to and from a bed to a chair (including a wheelchair)?: A Little Help needed standing up from a chair using your arms (e.g., wheelchair or bedside chair)?: A Little Help needed to walk in hospital room?: A Little Help needed climbing 3-5 steps with a railing? : Total 6 Click Score: 14    End of Session   Activity Tolerance: Patient tolerated treatment well Patient left: with call bell/phone within reach;in chair;with family/visitor present(with OT) Nurse Communication: Mobility status PT Visit Diagnosis: Other abnormalities of gait and mobility (R26.89);Muscle weakness (generalized) (M62.81);Pain Pain - part of body: (back)     Time: [id number] PT Time Calculation (min) (ACUTE ONLY): 34 min  Charges:  $Gait Training: 8-22 mins $Self Care/Home Management: 8-22                       Barry Brunner, PT       Rexanne Mano 11/18/2018, 2:16 PM

## 2018-11-18 NOTE — Progress Notes (Signed)
PROGRESS NOTE    ANNEBELLE FICHTER  [id number] DOB: February 09, 1970 DOA: 11/14/2018 PCP: Lin Landsman, MD  Brief Narrative: :48 year old female with history of Graves' disease, thyroid cancer, hypertension, depression, arthritis CAD,, status post L4-L5 interbody fusion. When I saw her this morning she was sleeping but easily arousable. She reported she has been having progressively worsening edema and weight gain for the last few months. She has been to her PCP. She has progressive worsening of shortness of breath, denies any chest pain during diarrhea. Today she complains of severe spasm in the left lower extremity.  11/16/2018 her main complaint is her left lower extremity spasm and pain and she is very tearful about it  11/17/2018 patient awake and alert sitting up in bed trying to eat breakfast more awake alert and comfortable compared to yesterday.  Still complains of a lot of pain in the lower extremity but at least she was able to allow me to touch her legs today  11/18/2018 patient sitting up does not have the oxygen on  Very talketive  today refuses to go to SNF, wants to go home she states she has people at home to take care of her like her father mother and brother.  Edema coming down breathing better saturation 96% on room air  Assessment & Plan:   Active Problems:   Degenerative spondylolisthesis   S/P lumbar fusion   Surgery, elective   S/P PICC central line placement   Hypoxemia    #1 anasarca with fluid overload over the course of few months prior to admission to hospital.  Work-up revealed normal ejection fraction UA with no proteinuria chest x-ray with mild vascular congestion.  TSH was 13 which is not enough to cause kind of fluid overload.  I will continue Lasix she is negative by 20 728!!!  Continue IV Lasix she is still fluid overloaded.  May consider discharge tomorrow if she remains stable overnight.   #2 status post decompression fusion of L4-L5 per  neurosurgery.  #3 hypertension blood pressure today is 119/77 continue to hold Zestril and Procardia and continue Lasix cautiously.  #4 hypothyroidism continue Synthroid at 150 cc.   Estimated body mass index is 52.6 kg/m as calculated from the following:   Height as of this encounter: 5' 5.5" (1.664 m).   Weight as of this encounter: 145.6 kg.     Subjective:  Feeling better swelling decreased still volume overloaded with edema Objective: Vitals:   11/17/18 2342 11/18/18 0351 11/18/18 0728 11/18/18 0730  BP: 128/74 133/77 (!) 151/76   Pulse: 97 100 98   Resp: 18   20  Temp: 98.6 F (37 C) 99.4 F (37.4 C) 99.1 F (37.3 C)   TempSrc: Oral Oral Oral   SpO2: 97% 99% 100%   Weight:      Height:        Intake/Output Summary (Last 24 hours) at 11/18/2018 1110 Last data filed at 11/18/2018 1100 Gross per 24 hour  Intake 600 ml  Output 7825 ml  Net -7225 ml   Filed Weights   11/14/18 0635  Weight: (!) 145.6 kg    Examination:  General exam: Appears calm and comfortable  Respiratory system: Clear to auscultation. Respiratory effort normal. Cardiovascular system: S1 & S2 heard, RRR. No JVD, murmurs, rubs, gallops or clicks. No pedal edema. Gastrointestinal system: Abdomen is nondistended, soft and nontender. No organomegaly or masses felt. Normal bowel sounds heard. Central nervous system: Alert and oriented. No focal neurological deficits. Extremities: 3+  pitting edema with creases indicating she has  fluid loss Skin: No rashes, lesions or ulcers Psychiatry: Judgement and insight appear normal. Mood & affect appropriate.     Data Reviewed: I have personally reviewed following labs and imaging studies  CBC: Recent Labs  Lab 11/11/18 1502 11/15/18 0950 11/16/18 0831  WBC 8.2 10.9* 10.2  NEUTROABS 4.4 7.2  --   HGB 12.3 9.5* 9.9*  HCT 37.9 29.2* 30.5*  MCV 98.4 97.0 97.1  PLT 497* 319 [id number]   Basic Metabolic Panel: Recent Labs  Lab 11/11/18 1502  11/15/18 0950 11/16/18 0831 11/17/18 0500 11/18/18 0401  NA 139 137 137 133* 136  K 4.5 4.3 3.9 3.8 4.2  CL 103 103 99 96* 98  CO2 25 26 28 27 28   GLUCOSE 96 112* 99 108* 100*  BUN 9 8 6 7 6   CREATININE 0.71 0.68 0.55 0.62 0.52  CALCIUM 9.6 8.9 8.9 8.6* 9.2  MG  --  2.0  --   --   --    GFR: Estimated Creatinine Clearance: 123.8 mL/min (by C-G formula based on SCr of 0.52 mg/dL). Liver Function Tests: Recent Labs  Lab 11/15/18 0950 11/16/18 0831  AST 32 27  ALT 13 15  ALKPHOS 49 53  BILITOT 0.4 0.5  PROT 5.9* 6.3*  ALBUMIN 3.0* 3.2*   No results for input(s): LIPASE, AMYLASE in the last 168 hours. No results for input(s): AMMONIA in the last 168 hours. Coagulation Profile: No results for input(s): INR, PROTIME in the last 168 hours. Cardiac Enzymes: No results for input(s): CKTOTAL, CKMB, CKMBINDEX, TROPONINI in the last 168 hours. BNP (last 3 results) No results for input(s): PROBNP in the last 8760 hours. HbA1C: No results for input(s): HGBA1C in the last 72 hours. CBG: No results for input(s): GLUCAP in the last 168 hours. Lipid Profile: No results for input(s): CHOL, HDL, LDLCALC, TRIG, CHOLHDL, LDLDIRECT in the last 72 hours. Thyroid Function Tests: No results for input(s): TSH, T4TOTAL, FREET4, T3FREE, THYROIDAB in the last 72 hours. Anemia Panel: No results for input(s): VITAMINB12, FOLATE, FERRITIN, TIBC, IRON, RETICCTPCT in the last 72 hours. Sepsis Labs: No results for input(s): PROCALCITON, LATICACIDVEN in the last 168 hours.  Recent Results (from the past 240 hour(s))  Novel Coronavirus, NAA (Hosp order, Send-out to Ref Lab; TAT 18-24 hrs     Status: None   Collection Time: 11/11/18 12:34 PM   Specimen: Nasopharyngeal Swab; Respiratory  Result Value Ref Range Status   SARS-CoV-2, NAA NOT DETECTED NOT DETECTED Final    Comment: (NOTE) This nucleic acid amplification test was developed and its performance characteristics determined by [employer]. Nucleic acid amplification tests include PCR and TMA. This test has not been FDA cleared or approved. This test has been authorized by FDA under an Emergency Use Authorization (EUA). This test is only authorized for the duration of time the declaration that circumstances exist justifying the authorization of the emergency use of in vitro diagnostic tests for detection of SARS-CoV-2 virus and/or diagnosis of COVID-19 infection under section 564(b)(1) of the Act, 21 U.S.C. [id number]) (1), unless the authorization is terminated or revoked sooner. When diagnostic testing is negative, the possibility of a false negative result should be considered in the context of a patient's recent exposures and the presence of clinical signs and symptoms consistent with COVID-19. An individual without symptoms of COVID- 19 and who is not shedding SARS-CoV-2 vi rus would expect to have a negative (not detected) result in this  assay. Performed At: [facility] [address] [place], [place] [id number] Rush Farmer MD [id number]    [place]  Final    Comment: Performed at [facility], [place] [address] [place], [place] [address]  Surgical pcr screen     Status: None   Collection Time: 11/11/18  3:00 PM   Specimen: Nasal Mucosa; Nasal Swab  Result Value Ref Range Status   MRSA, PCR NEGATIVE NEGATIVE Final   Staphylococcus aureus NEGATIVE NEGATIVE Final    Comment: (NOTE) The Xpert SA Assay (FDA approved for NASAL specimens in patients 29 years of age and older), is one component of a comprehensive surveillance program. It is not intended to diagnose infection nor to guide or monitor treatment. Performed at [facility], [place] [address], [place], [place] [address]          Radiology Studies: No results found.      Scheduled Meds: . Chlorhexidine Gluconate Cloth  6 each Topical Daily  . famotidine  20 mg Oral BID  .  levothyroxine  150 mcg Oral Q0600  . loratadine  10 mg Oral Daily  . potassium chloride  20 mEq Oral BID  . sodium chloride flush  3 mL Intravenous Q12H   Continuous Infusions: . sodium chloride 250 mL (11/14/18 1618)     LOS: 4 days     Georgette Shell, MD [facility]  If 7PM-7AM, please contact night-coverage [email or web address] Password [email or web address] 11/18/2018, 11:10 AM

## 2018-11-18 NOTE — TOC Initial Note (Signed)
Transition of Care [facility]) - Initial/Assessment Note    Patient Details  Name: Tonya Greene MRN: [id number] Date of Birth: 03-12-1970  Transition of Care [facility]) CM/SW Contact:    Vinie Sill, [place] Phone Number: 11/18/2018, 6:10 PM  Clinical Narrative:                  CSW visit with the patient ay bedside along with her mother. CSW introduced self and explained role. CSW discussed PT recommendation of ST rehab at [facility].  Patient and her mother declined SNF at this time. Patient states she lives in the home with her mother and has the care and support she needs at home. Patient states she has a walker. CSW provided patient with [email or web address] HH choice list. Patient states no questions or concerns at this time.    Thurmond Butts, MSW, [facility] Clinical Social Worker [phone]   Expected Discharge Plan: [place]     Patient Goals and CMS Choice   CMS Medicare.gov Compare Post Acute Care list provided to:: Patient Choice offered to / list presented to : Patient  Expected Discharge Plan and Services Expected Discharge Plan: [place]                                              Prior Living Arrangements/Services   Lives with:: Self, Parents          Need for Family Participation in Patient Care: Yes (Comment) Care giver support system in place?: Yes (comment)   Criminal Activity/Legal Involvement Pertinent to Current Situation/Hospitalization: No - Comment as needed  Activities of Daily Living Home Assistive Devices/Equipment: Wheelchair, Environmental consultant (specify type), Cane (specify quad or straight), Blood pressure cuff, Shower chair without back ADL Screening (condition at time of admission) Patient's cognitive ability adequate to safely complete daily activities?: Yes Is the patient deaf or have difficulty hearing?: No Does the patient have difficulty seeing, even when wearing glasses/contacts?: No Does the patient have  difficulty concentrating, remembering, or making decisions?: No Patient able to express need for assistance with ADLs?: Yes Does the patient have difficulty dressing or bathing?: No Independently performs ADLs?: Yes (appropriate for developmental age) Does the patient have difficulty walking or climbing stairs?: Yes Weakness of Legs: Both Weakness of Arms/Hands: Both  Permission Sought/Granted Permission sought to share information with : Family Supports Permission granted to share information with : Yes, Verbal Permission Granted  Share Information with NAME: Neva Seat  Permission granted to share info w AGENCY: [place] granted to share info w Relationship: mother  Permission granted to share info w Contact Information: [phone]  Emotional Assessment Appearance:: Appears stated age Attitude/Demeanor/Rapport: Engaged Affect (typically observed): Accepting, Appropriate, Pleasant Orientation: : Oriented to Self, Oriented to Place, Oriented to  Time, Oriented to Situation Alcohol / Substance Use: Not Applicable Psych Involvement: No (comment)  Admission diagnosis:  Spondylolisthesis Patient Active Problem List   Diagnosis Date Noted  . Hypoxemia   . Surgery, elective   . S/P PICC central line placement   . Degenerative spondylolisthesis 11/14/2018  . S/P lumbar fusion 11/14/2018  . 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 GERD (gastroesophageal reflux disease) 07/24/2016  . Ganglion cyst of right foot 04/12/2015  . Graves disease 10/29/2013  . Benign essential HTN 10/29/2013  . Lumbar disc disease 10/29/2013  . Morbid obesity with  BMI of 45.0-49.9, adult ([place]) 10/29/2013   PCP:  Lin Landsman, MD Pharmacy:   [facility] [place], [place] [address] Phone: [phone] Fax: [place], [place] - [address] [place] [address] Phone: [phone] Fax: [phone]  [facility] [place], [place] [address] Phone: [phone] Fax: [phone]  [place], [place] - [address] [place] [address] Phone: [phone] Fax: [phone]     Social Determinants of Health (SDOH) Interventions    Readmission Risk Interventions No flowsheet data found.

## 2018-11-18 NOTE — Progress Notes (Signed)
Postop day 4.  Patient making slow steady progress.  Still with quite a bit of pain in her left leg which is mostly dysesthetic involving her left knee and anterior leg.  Back pain reasonably well controlled.  Overall medical condition continues to improve.  She is afebrile.  Her vital signs are stable.  Her wound is healing well.  Drain output minimal.  Motor and sensory function stable.  Edema improving.  Making slow steady progress.  Continue efforts at mobilization.  Patient does not wish discharge to skilled nursing facility and is striving for discharge home.  We will continue efforts at therapy and work toward possible discharge over the next day or 2.

## 2018-11-19 MED ORDER — POTASSIUM CHLORIDE ER 10 MEQ PO TBCR: 20.0000 meq | EXTENDED_RELEASE_TABLET | Freq: Two times a day (BID) | ORAL | 2 refills | Status: AC

## 2018-11-19 MED ORDER — FUROSEMIDE 10 MG/ML IJ SOLN: 60.0000 mg | Freq: Two times a day (BID) | INTRAMUSCULAR | Status: DC

## 2018-11-19 MED ORDER — FUROSEMIDE 10 MG/ML IJ SOLN
40.0000 mg | Freq: Two times a day (BID) | INTRAMUSCULAR | Status: DC
  Administered 2018-11-19 – 2018-11-21 (×4): 40 mg via INTRAVENOUS
  Filled 2018-11-19 (×4): qty 4

## 2018-11-19 MED ORDER — POTASSIUM CHLORIDE CRYS ER 20 MEQ PO TBCR
20.0000 meq | EXTENDED_RELEASE_TABLET | Freq: Two times a day (BID) | ORAL | Status: DC
  Administered 2018-11-19 – 2018-11-20 (×4): 20 meq via ORAL
  Filled 2018-11-19 (×5): qty 1

## 2018-11-19 MED ORDER — FUROSEMIDE 10 MG/ML IJ SOLN
20.0000 mg | Freq: Four times a day (QID) | INTRAMUSCULAR | Status: DC
  Administered 2018-11-19: 20 mg via INTRAVENOUS

## 2018-11-19 MED ORDER — FUROSEMIDE 40 MG PO TABS: 40.0000 mg | ORAL_TABLET | Freq: Two times a day (BID) | ORAL | 2 refills | Status: AC

## 2018-11-19 MED ORDER — LISINOPRIL 10 MG PO TABS: 10.0000 mg | ORAL_TABLET | Freq: Every day | ORAL | 1 refills | Status: AC

## 2018-11-19 MED ORDER — LEVOTHYROXINE SODIUM 150 MCG PO TABS: 150.0000 ug | ORAL_TABLET | Freq: Every day | ORAL | 2 refills | Status: DC

## 2018-11-19 NOTE — Progress Notes (Signed)
Postop day 5.  Patient continues to make slow progress.  Pain better controlled.  Still with quite a bit of left lower extremity pain but is able to stand and walk a little bit now.  BaCk pain controlled.  She is afebrile.  Vital signs are stable.  Urine output remains high as patient continues to diurese.  Awake and alert.  Looks more comfortable and looks, less toxic.  Mental status much more appropriate data at admission.  Motor and sensory function stable still with some left quadriceps and dorsiflexion weakness about the same from preop.  Wound clean and dry.  Chest and abdomen benign.  Overall progressing slowly.  Likely to continue with therapy for another day.  Look towards discharge tomorrow.

## 2018-11-19 NOTE — Progress Notes (Signed)
Physical Therapy Treatment Patient Details Name: Tonya Greene MRN: [id number] DOB: [birth year] Today's Date: 11/19/2018    History of Present Illness 48 y.o. female, with past medical history significant for CAD, hypertension, depression, arthritis, Graves' disease, and hypothyroidism. She underwent PLIF 11/14/18.    PT Comments    Patient continues to move slowly (normal for her baseline), but tolerated incr actvity today. She walked a short distance (as she does at baseline) and needed min assist to lift her legs onto bed to return to bed. NOTE: she reports she was told she does not need to wear her brace because it rides up above the level it is supposed to be supporting. She denied that Dr. Trenton Gammon told her this. She wore brace during PT session and instructed she needs to discuss with Dr. Trenton Gammon. RN also made aware     Follow Up Recommendations  Home health PT;Supervision for mobility/OOB     Equipment Recommendations  Bariatric BSC; bariatric RW (pt & mother report that the walker at home is her mother's and she has not had equipment of appropriate size for herself)    Recommendations for Other Services       Precautions / Restrictions Precautions Precautions: Fall;Back Required Braces or Orthoses: Spinal Brace Spinal Brace: Lumbar corset;Applied in sitting position(pt up in chair without brace on arrival; ) Restrictions Weight Bearing Restrictions: No Other Position/Activity Restrictions: Brace in room    Mobility  Bed Mobility Overal bed mobility: Needs Assistance Bed Mobility: Sit to Sidelying;Rolling Rolling: Modified independent (Device/Increase time)       Sit to sidelying: Min assist General bed mobility comments: vc for technique to return to bed to maintain back precautions  Transfers Overall transfer level: Needs assistance Equipment used: Rolling walker (2 wheeled);2 person hand held assist Transfers: Sit to/from [employer] Transfers Sit to  Stand: +2 physical assistance;Min assist;+2 safety/equipment Stand pivot transfers: +2 physical assistance;Min assist       General transfer comment: pt rocks and uses momentum to come to stand; assist for greater anterior wt-shift over her feet and then come to stand; stood x 2 (bed and chair)  Ambulation/Gait Ambulation/Gait assistance: Min guard;+2 safety/equipment Gait Distance (Feet): 4 Feet Assistive device: Rolling walker (2 wheeled) Gait Pattern/deviations: Step-to pattern;Decreased stride length;Wide base of support;Shuffle     General Gait Details: walked chair to bed, including pt overshooting EOB and had to walk backwards to reach proper place   Stairs             Wheelchair Mobility    Modified Rankin (Stroke Patients Only)       Balance Overall balance assessment: Needs assistance Sitting-balance support: Bilateral upper extremity supported;Feet supported Sitting balance-Leahy Scale: Fair   Postural control: Right lateral lean;Posterior lean Standing balance support: Bilateral upper extremity supported;During functional activity Standing balance-Leahy Scale: Poor Standing balance comment: light support thru UEs on RW; stood x 2 mintues                            Cognition Arousal/Alertness: Awake/alert Behavior During Therapy: WFL for tasks assessed/performed Overall Cognitive Status: Within Functional Limits for tasks assessed                                        Exercises      General Comments General comments (skin integrity, edema, etc.): incr time  Physical Therapy Treatment Patient Details Name: Tonya Greene MRN: [id number] DOB: [birth year] Today's Date: 11/19/2018    History of Present Illness 48 y.o. female, with past medical history significant for CAD, hypertension, depression, arthritis, Graves' disease, and hypothyroidism. She underwent PLIF 11/14/18.    PT Comments    Patient continues to move slowly (normal for her baseline), but tolerated incr actvity today. She walked a short distance (as she does at baseline) and needed min assist to lift her legs onto bed to return to bed. NOTE: she reports she was told she does not need to wear her brace because it rides up above the level it is supposed to be supporting. She denied that Dr. Trenton Gammon told her this. She wore brace during PT session and instructed she needs to discuss with Dr. Trenton Gammon. RN also made aware     Follow Up Recommendations  Home health PT;Supervision for mobility/OOB     Equipment Recommendations  Bariatric BSC; bariatric RW (pt & mother report that the walker at home is her mother's and she has not had equipment of appropriate size for herself)    Recommendations for Other Services       Precautions / Restrictions Precautions Precautions: Fall;Back Required Braces or Orthoses: Spinal Brace Spinal Brace: Lumbar corset;Applied in sitting position(pt up in chair without brace on arrival; ) Restrictions Weight Bearing Restrictions: No Other Position/Activity Restrictions: Brace in room    Mobility  Bed Mobility Overal bed mobility: Needs Assistance Bed Mobility: Sit to Sidelying;Rolling Rolling: Modified independent (Device/Increase time)       Sit to sidelying: Min assist General bed mobility comments: vc for technique to return to bed to maintain back precautions  Transfers Overall transfer level: Needs assistance Equipment used: Rolling walker (2 wheeled);2 person hand held assist Transfers: Sit to/from [employer] Transfers Sit to  Stand: +2 physical assistance;Min assist;+2 safety/equipment Stand pivot transfers: +2 physical assistance;Min assist       General transfer comment: pt rocks and uses momentum to come to stand; assist for greater anterior wt-shift over her feet and then come to stand; stood x 2 (bed and chair)  Ambulation/Gait Ambulation/Gait assistance: Min guard;+2 safety/equipment Gait Distance (Feet): 4 Feet Assistive device: Rolling walker (2 wheeled) Gait Pattern/deviations: Step-to pattern;Decreased stride length;Wide base of support;Shuffle     General Gait Details: walked chair to bed, including pt overshooting EOB and had to walk backwards to reach proper place   Stairs             Wheelchair Mobility    Modified Rankin (Stroke Patients Only)       Balance Overall balance assessment: Needs assistance Sitting-balance support: Bilateral upper extremity supported;Feet supported Sitting balance-Leahy Scale: Fair   Postural control: Right lateral lean;Posterior lean Standing balance support: Bilateral upper extremity supported;During functional activity Standing balance-Leahy Scale: Poor Standing balance comment: light support thru UEs on RW; stood x 2 mintues                            Cognition Arousal/Alertness: Awake/alert Behavior During Therapy: WFL for tasks assessed/performed Overall Cognitive Status: Within Functional Limits for tasks assessed                                        Exercises      General Comments General comments (skin integrity, edema, etc.): incr time

## 2018-11-19 NOTE — Progress Notes (Signed)
PROGRESS NOTE    Tonya Greene  [id number] DOB: 21-Jul-1970 DOA: 11/14/2018 PCP: Lin Landsman, MD    Brief Narrative: 48 year old female history of Graves' disease, thyroid cancer, hypertension, CAD, depression, status post L4-L5 interbody fusion.  We were consulted for anasarca.  Patient reports having increasing weight gain and worsening edema for the few months.  She saw her primary doctor she was put on a low-dose Lasix which did not help her with the swelling.  She has ongoing complaints of severe spasm in lower extremities left more than right.  Assessment & Plan:   Active Problems:   Degenerative spondylolisthesis   S/P lumbar fusion   Surgery, elective   S/P PICC central line placement   Hypoxemia  #1 anasarca fluid overload over the course of the last few months prior to admission to hospital.  Patient has a sedentary lifestyle at home.  She has a whole family helping her her mother her husband her brother.  She is her body is very deconditioned. continue Lasix 40 mg twice a day, K. Dur 20 mEq twice a day and lisinopril 2.5 mg daily at the time of discharge.  She has diuresed very well she is negative by [phone].  She is still fluid overloaded.  She will need to stay on Lasix and follow-up with primary care physician.  She needs to obtain BMP in 1 week.  #2 hypothyroidism continue Synthroid.  #3 status post decompression fusion L4-L5 per neurosurgery  #4 hypertension continue Lasix 40 mg twice a day, decrease lisinopril to 2.5 mg daily upon discharge and continue Procardia.   I will sign off please call with questions  Estimated body mass index is 52.6 kg/m as calculated from the following:   Height as of this encounter: 5' 5.5" (1.664 m).   Weight as of this encounter: 145.6 kg.    Subjective: Patient is sitting up in the chair has IJ in place getting IV Lasix to that central line no other IV access Continues to complain of lower extremity spasms Objective: Vitals:    11/18/18 2001 11/19/18 0357 11/19/18 0755 11/19/18 0800  BP: 117/78 131/82  (!) 124/94  Pulse: 99 93 (!) 109 (!) 112  Resp: 18 18    Temp: 98.2 F (36.8 C) 98.2 F (36.8 C)  98.6 F (37 C)  TempSrc: Oral Oral  Oral  SpO2: 100% 98%  98%  Weight:      Height:        Intake/Output Summary (Last 24 hours) at 11/19/2018 1005 Last data filed at 11/19/2018 0646 Gross per 24 hour  Intake 360 ml  Output 5085 ml  Net -4725 ml   Filed Weights   11/14/18 0635  Weight: (!) 145.6 kg    Examination:  General exam: Appears calm and comfortable  Respiratory system: Clear to auscultation. Respiratory effort normal. Cardiovascular system: S1 & S2 heard, RRR. No JVD, murmurs, rubs, gallops or clicks. No pedal edema. Gastrointestinal system: Abdomen is nondistended, soft and nontender. No organomegaly or masses felt. Normal bowel sounds heard. Central nervous system: Alert and oriented. No focal neurological deficits. Extremities: 3+ pitting edema all the way above the knee Skin: No rashes, lesions or ulcers Psychiatry: Judgement and insight appear normal. Mood & affect appropriate.     Data Reviewed: I have personally reviewed following labs and imaging studies  CBC: Recent Labs  Lab 11/15/18 0950 11/16/18 0831  WBC 10.9* 10.2  NEUTROABS 7.2  --   HGB 9.5* 9.9*  HCT 29.2*  30.5*  MCV 97.0 97.1  PLT 319 [id number]   Basic Metabolic Panel: Recent Labs  Lab 11/15/18 0950 11/16/18 0831 11/17/18 0500 11/18/18 0401  NA 137 137 133* 136  K 4.3 3.9 3.8 4.2  CL 103 99 96* 98  CO2 26 28 27 28   GLUCOSE 112* 99 108* 100*  BUN 8 6 7 6   CREATININE 0.68 0.55 0.62 0.52  CALCIUM 8.9 8.9 8.6* 9.2  MG 2.0  --   --   --    GFR: Estimated Creatinine Clearance: 123.8 mL/min (by C-G formula based on SCr of 0.52 mg/dL). Liver Function Tests: Recent Labs  Lab 11/15/18 0950 11/16/18 0831  AST 32 27  ALT 13 15  ALKPHOS 49 53  BILITOT 0.4 0.5  PROT 5.9* 6.3*  ALBUMIN 3.0* 3.2*   No  results for input(s): LIPASE, AMYLASE in the last 168 hours. No results for input(s): AMMONIA in the last 168 hours. Coagulation Profile: No results for input(s): INR, PROTIME in the last 168 hours. Cardiac Enzymes: No results for input(s): CKTOTAL, CKMB, CKMBINDEX, TROPONINI in the last 168 hours. BNP (last 3 results) No results for input(s): PROBNP in the last 8760 hours. HbA1C: No results for input(s): HGBA1C in the last 72 hours. CBG: No results for input(s): GLUCAP in the last 168 hours. Lipid Profile: No results for input(s): CHOL, HDL, LDLCALC, TRIG, CHOLHDL, LDLDIRECT in the last 72 hours. Thyroid Function Tests: No results for input(s): TSH, T4TOTAL, FREET4, T3FREE, THYROIDAB in the last 72 hours. Anemia Panel: No results for input(s): VITAMINB12, FOLATE, FERRITIN, TIBC, IRON, RETICCTPCT in the last 72 hours. Sepsis Labs: No results for input(s): PROCALCITON, LATICACIDVEN in the last 168 hours.  Recent Results (from the past 240 hour(s))  Novel Coronavirus, NAA (Hosp order, Send-out to Ref Lab; TAT 18-24 hrs     Status: None   Collection Time: 11/11/18 12:34 PM   Specimen: Nasopharyngeal Swab; Respiratory  Result Value Ref Range Status   SARS-CoV-2, NAA NOT DETECTED NOT DETECTED Final    Comment: (NOTE) This nucleic acid amplification test was developed and its performance characteristics determined by [employer]. Nucleic acid amplification tests include PCR and TMA. This test has not been FDA cleared or approved. This test has been authorized by FDA under an Emergency Use Authorization (EUA). This test is only authorized for the duration of time the declaration that circumstances exist justifying the authorization of the emergency use of in vitro diagnostic tests for detection of SARS-CoV-2 virus and/or diagnosis of COVID-19 infection under section 564(b)(1) of the Act, 21 U.S.C. [id number]) (1), unless the authorization is terminated or revoked sooner. When  diagnostic testing is negative, the possibility of a false negative result should be considered in the context of a patient's recent exposures and the presence of clinical signs and symptoms consistent with COVID-19. An individual without symptoms of COVID- 19 and who is not shedding SARS-CoV-2 vi rus would expect to have a negative (not detected) result in this assay. Performed At: [facility] [address] [place], [place] [id number] Rush Farmer MD [id number]    [place]  Final    Comment: Performed at [facility], [place] [address] [place], [place] [address]  Surgical pcr screen     Status: None   Collection Time: 11/11/18  3:00 PM   Specimen: Nasal Mucosa; Nasal Swab  Result Value Ref Range Status   MRSA, PCR NEGATIVE NEGATIVE Final   Staphylococcus aureus NEGATIVE NEGATIVE Final  Comment: (NOTE) The Xpert SA Assay (FDA approved for NASAL specimens in patients 92 years of age and older), is one component of a comprehensive surveillance program. It is not intended to diagnose infection nor to guide or monitor treatment. Performed at [facility], [place] [address], [place], [place] [address]          Radiology Studies: No results found.      Scheduled Meds: . Chlorhexidine Gluconate Cloth  6 each Topical Daily  . famotidine  20 mg Oral BID  . furosemide  20 mg Intravenous Q6H  . levothyroxine  150 mcg Oral Q0600  . loratadine  10 mg Oral Daily  . sodium chloride flush  3 mL Intravenous Q12H   Continuous Infusions: . sodium chloride 250 mL (11/14/18 1618)     LOS: 5 days     Georgette Shell, MD [facility] If 7PM-7AM, please contact night-coverage [email or web address] Password [email or web address] 11/19/2018, 10:05 AM

## 2018-11-20 NOTE — Progress Notes (Signed)
Overall patient making good progress.  Situation much improved following significant diuresis and increase in her thyroid level.  Pain level much better.  She is participating in therapy.  She feels that she will be ready for discharge tomorrow.  Afebrile.  Vital signs are stable.  Motor and sensory function stable.  Wound clean and dry.  Making good progress.  Plan for discharge home tomorrow.

## 2018-11-20 NOTE — Progress Notes (Signed)
Physical Therapy Treatment Patient Details Name: Tonya Greene MRN: [id number] DOB: March 28, 1970 Today's Date: 11/20/2018    History of Present Illness 48 y.o. female, with past medical history significant for CAD, hypertension, depression, arthritis, Graves' disease, and hypothyroidism. She underwent PLIF 11/14/18.    PT Comments    Patient's discharge again delayed. She has made good progress with PT and stood with minguard assist from bed today. She walked 4 ft with RW with no c/o pain. She is currently mobilizing as well if not better than she was prior to surgery (she was very limited prior, primarily using w/c). Prior to session, spoke with Dr. Annette Stable re: her brace and he states she did not need to wear it due to her body habitus and poor fit.     Follow Up Recommendations  Home health PT;Supervision for mobility/OOB     Equipment Recommendations  Bariatric BSC; bariatric RW    Recommendations for Other Services       Precautions / Restrictions Precautions Precautions: Fall;Back Precaution Comments: reviewed 3/3 back precautions; pt required incr educ re: flex at hips, not spine; vc x1 to avoid twisting Required Braces or Orthoses: (spoke to Dr. Annette Stable and he's OK with no brace) Spinal Brace: (spoke to Dr. Annette Stable and he's OK with no brace) Restrictions Weight Bearing Restrictions: No    Mobility  Bed Mobility               General bed mobility comments: sitting EOB on arrival (with NT doing bath)  Transfers Overall transfer level: Needs assistance Equipment used: Rolling walker (2 wheeled);2 person hand held assist Transfers: Sit to/from Stand Sit to Stand: +2 safety/equipment;Min guard         General transfer comment: vc for hand placment; bed elevated to simulate home; pt rocks and uses momentum to come to stand  Ambulation/Gait Ambulation/Gait assistance: Min guard Gait Distance (Feet): 4 Feet Assistive device: Rolling walker (2 wheeled) Gait  Pattern/deviations: Step-to pattern;Decreased stride length;Wide base of support;Shuffle     General Gait Details: walked bed to chair with better upright posture, continued wide BOS and sliding rt foot (pt refuses gripper socks, feels she slips in them)   Stairs             Wheelchair Mobility    Modified Rankin (Stroke Patients Only)       Balance Overall balance assessment: Needs assistance Sitting-balance support: Feet supported Sitting balance-Leahy Scale: Fair     Standing balance support: Bilateral upper extremity supported;During functional activity Standing balance-Leahy Scale: Fair Standing balance comment: light support thru UEs on RW; stood x 3 mintues for pericare                            Cognition Arousal/Alertness: Awake/alert Behavior During Therapy: WFL for tasks assessed/performed Overall Cognitive Status: Within Functional Limits for tasks assessed                                 General Comments: pt talkative and does not like to be rushed; responds to re-directing to task ok      Exercises      General Comments General comments (skin integrity, edema, etc.): incr time for all mobility due to pt's personality and does not like to be rushed      Pertinent Vitals/Pain Pain Assessment: Faces Faces Pain Scale: Hurts a little bit Pain Location:  back Pain Descriptors / Indicators: Aching;Discomfort;Constant Pain Intervention(s): Limited activity within patient's tolerance;Monitored during session;Repositioned    Home Living                      Prior Function            PT Goals (current goals can now be found in the care plan section) Acute Rehab PT Goals Patient Stated Goal: decrease pain and edema PT Goal Formulation: With patient Time For Goal Achievement: 11/29/18 Potential to Achieve Goals: Good Progress towards PT goals: Goals met and updated - see care plan    Frequency    Min  5X/week      PT Plan Current plan remains appropriate    Co-evaluation              AM-PAC PT "6 Clicks" Mobility   Outcome Measure  Help needed turning from your back to your side while in a flat bed without using bedrails?: A Little Help needed moving from lying on your back to sitting on the side of a flat bed without using bedrails?: A Little Help needed moving to and from a bed to a chair (including a wheelchair)?: A Little Help needed standing up from a chair using your arms (e.g., wheelchair or bedside chair)?: A Little Help needed to walk in hospital room?: A Little Help needed climbing 3-5 steps with a railing? : Total 6 Click Score: 16    End of Session   Activity Tolerance: Patient tolerated treatment well Patient left: with call bell/phone within reach;in chair;with family/visitor present   PT Visit Diagnosis: Other abnormalities of gait and mobility (R26.89);Muscle weakness (generalized) (M62.81);Pain Pain - part of body: (back)     Time: 8915-5253 PT Time Calculation (min) (ACUTE ONLY): 32 min  Charges:  $Gait Training: 23-37 mins                       Barry Brunner, PT       Rexanne Mano 11/20/2018, 5:36 PM

## 2018-11-21 MED ORDER — DIAZEPAM 5 MG PO TABS: 5.0000 mg | ORAL_TABLET | Freq: Four times a day (QID) | ORAL | 0 refills | Status: AC | PRN

## 2018-11-21 MED ORDER — OXYCODONE HCL 10 MG PO TABS: 10.0000 mg | ORAL_TABLET | ORAL | 0 refills | Status: AC | PRN

## 2018-11-21 NOTE — Discharge Summary (Signed)
Physician Discharge Summary  Patient ID: Tonya Greene MRN: [id number] DOB/AGE: 09-07-1970 48 y.o.  Admit date: 11/14/2018 Discharge date: 11/21/2018  Admission Diagnoses:  Discharge Diagnoses:  Active Problems:   Degenerative spondylolisthesis   S/P lumbar fusion   Surgery, elective   S/P PICC central line placement   Hypoxemia   Discharged Condition: fair  Hospital Course: Patient admitted to the hospital for treatment of severe lumbar pain with increasing radiating pain weakness and immobility.  Upon presentation the patient was medically decompensating with a significant worsening peripheral edema and likely poor medical compliance.  The patient was taken to the operating room where an uncomplicated 99991111 and 99991111 decompression and fusion was performed.  Postoperatively the patient's fluid status and hypothyroidism has been addressed with medical changes.  She has diuresed nicely.  Her thyroid level is returning toward normal.  Postoperatively she still has quite a bit of back pain and still has some left lower extremity pain but these symptoms are improving with time.  She is mobilizing with therapy.  She has been offered skilled nursing facility placement for further convalescence but she is declined this and wishes to go home with home therapy.  Currently she is tolerating regular diet.  She is awake and alert.  She is participating in therapy.  Consults:   Significant Diagnostic Studies:   Treatments:   Discharge Exam: Blood pressure 121/87, pulse (!) 105, temperature 98.6 F (37 C), temperature source Oral, resp. rate 18, height 5' 5.5" (1.664 m), weight (!) 145.6 kg, last menstrual period 06/14/2018, SpO2 98 %. Awake and alert.  Oriented and reasonably appropriate.  Cranial nerve function intact.  Motor and sensory function extremities reveals some mild weakness of the left quadriceps muscle group.  She has weakness of her left anterior tibialis and extensor hallucis longus  which is stable from preop.  Sensory examination is nonfocal.  She still has some dysesthetic pain in her left anterior leg.  Wound clean and dry.  Chest and abdomen obese but otherwise benign.  Extremities are still edematous but much improved from preop.  Disposition: Discharge disposition: 01-Home or Self Care       Discharge Instructions    Face-to-face encounter (required for Medicare/Medicaid patients)   Complete by: As directed    I Charlie Pitter certify that this patient is under my care and that I, or a nurse practitioner or physician's assistant working with me, had a face-to-face encounter that meets the physician face-to-face encounter requirements with this patient on 11/21/2018. The encounter with the patient was in whole, or in part for the following medical condition(s) which is the primary reason for home health care (List medical condition): Degenerative spondylolisthesis,   The encounter with the patient was in whole, or in part, for the following medical condition, which is the primary reason for home health care: degenerative spondylolithesis   I certify that, based on my findings, the following services are medically necessary home health services: Physical therapy   Reason for Medically Necessary Home Health Services: Therapy- Home Adaptation to Facilitate Safety   My clinical findings support the need for the above services: Unable to leave home safely without assistance and/or assistive device   Further, I certify that my clinical findings support that this patient is homebound due to: Pain interferes with ambulation/mobility   Home Health   Complete by: As directed    To provide the following care/treatments:  PT OT       Allergies as of 11/21/2018  Reactions   Tape    Tears skin      Medication List    STOP taking these medications   carisoprodol 350 MG tablet Commonly known as: SOMA   oxyCODONE-acetaminophen 10-325 MG tablet Commonly known as:  PERCOCET   oxyCODONE-acetaminophen 5-325 MG tablet Commonly known as: Percocet     TAKE these medications   albuterol 108 (90 Base) MCG/ACT inhaler Commonly known as: VENTOLIN HFA Inhale 2 puffs into the lungs every 6 (six) hours as needed for wheezing or shortness of breath.   cetirizine 10 MG tablet Commonly known as: ZYRTEC Take 1 tablet (10 mg total) by mouth daily. Need annual visit for further refills What changed: additional instructions   diazepam 5 MG tablet Commonly known as: VALIUM Take 1-2 tablets (5-10 mg total) by mouth every 6 (six) hours as needed for muscle spasms.   famotidine 20 MG tablet Commonly known as: PEPCID Take 20 mg by mouth 2 (two) times daily.   furosemide 40 MG tablet Commonly known as: Lasix Take 1 tablet (40 mg total) by mouth 2 (two) times daily. What changed:   medication strength  how much to take  when to take this   levothyroxine 150 MCG tablet Commonly known as: SYNTHROID Take 1 tablet (150 mcg total) by mouth daily at 6 (six) AM. What changed:   medication strength  See the new instructions.   lisinopril 10 MG tablet Commonly known as: ZESTRIL Take 1 tablet (10 mg total) by mouth daily.   NIFEdipine 60 MG 24 hr tablet Commonly known as: ADALAT CC TAKE 1 TABLET BY MOUTH DAILY   Oxycodone HCl 10 MG Tabs Take 1 tablet (10 mg total) by mouth every 3 (three) hours as needed for severe pain ((score 7 to 10)).   potassium chloride 10 MEQ tablet Commonly known as: KLOR-CON Take 2 tablets (20 mEq total) by mouth 2 (two) times daily.   promethazine 6.25 MG/5ML syrup Commonly known as: PHENERGAN Take 10 mLs by mouth 3 (three) times daily.            Durable Medical Equipment  (From admission, onward)         Start     Ordered   11/20/18 1648  DME 3 n 1  Once    Comments: Bariatric   11/20/18 1647   11/14/18 1331  DME Walker rolling  Once    Question:  Patient needs a walker to treat with the following condition   Answer:  Degenerative spondylolisthesis   11/14/18 1330         Follow-up Information    Lin Landsman, MD Follow up.   Specialty: Family Medicine Contact information: [place] [address] [phone]           Signed: Charlie Pitter 11/21/2018, 11:59 AM

## 2018-11-21 NOTE — TOC Progression Note (Signed)
Transition of Care (TOC) - Progression Note  Marvetta Gibbons RN,BSN Transitions of Care Unit 4NP - RN Case Manager [phone]   Patient Details  Name: Tonya Greene MRN: [id number] Date of Birth: Mar 14, 1970  Transition of Care [facility]) CM/SW Contact  Dahlia Client Romeo Rabon, RN Phone Number: 11/21/2018, 1:38 PM  Clinical Narrative:    Follow up done with pt for [facility] choice- pt and mom state that they are still reviewing list- Orders still pending for PT/OT- pt has walker at home, but need bariatric 3n1- order has been placed for DME- call made to [place] with [facility] who will deliver 3n1 to room prior to discharge. CM will f/u in am for [facility] choice and referral.    Expected Discharge Plan: [place] Barriers to Discharge: Barriers Resolved  Expected Discharge Plan and Services Expected Discharge Plan: [place] In-house Referral: Clinical Social Work Discharge Planning Services: CM Consult Post Acute Care Choice: Home Health, Durable Medical Equipment Living arrangements for the past 2 months: Mobile Home Expected Discharge Date: 11/21/18               DME Arranged: 3-N-1 DME Agency: [facility] Date DME Agency Contacted: 11/20/18 Time DME Agency Contacted: 1500 Representative spoke with at DME Agency: Thedore Mins HH Arranged: PT, OT [place] Agency: [place] Date [place]: 11/21/18 Time [place]: 1300 Representative spoke with at [place]: [place] ([place]) Interventions    Readmission Risk Interventions Readmission Risk Prevention Plan 11/21/2018  Post Dischage Appt Complete  Medication Screening Complete  Transportation Screening Complete  Some recent data might be hidden

## 2018-11-21 NOTE — TOC Transition Note (Signed)
Transition of Care [facility]) - CM/SW Discharge Note Marvetta Gibbons RN,BSN Transitions of Care Unit 4NP - RN Case Manager [phone]   Patient Details  Name: Tonya Greene MRN: [id number] Date of Birth: Aug 15, 1970  Transition [facility] Care [facility]) CM/SW Contact:  Dawayne Patricia, RN Phone Number: 11/21/2018, 1:36 PM   Clinical Narrative:    Pt stable for transition home today- orders for [facility] and DME have been placed- pt provided list for [facility] choice Per CMS guidelines from medicare.gov website with star ratings (copy placed in shadow chart)- CM spoke with pt at bedside- per pt her first choice is [facility] and second choice [facility] if [facility] unable to accept referral. 3n1 has been delivered to room for home. Pt reports her mom has home ready and can provide transport. Call made to [facility] with [facility] for HHPT/OT referral- referral has been accepted.    Final next level of care: [place] Barriers to Discharge: Barriers Resolved   Patient Goals and CMS Choice Patient states their goals for this hospitalization and ongoing recovery are:: return home with therapy CMS Medicare.gov Compare Post Acute Care list provided to:: Patient Choice offered to / list presented to : Patient  Discharge Placement          Home with [facility]            Discharge Plan and Services In-house Referral: Clinical Social Work Discharge Planning Services: CM Consult Post Acute Care Choice: Home Health, Durable Medical Equipment          DME Arranged: 3-N-1 DME Agency: [facility] Date DME Agency Contacted: 11/20/18 Time DME Agency Contacted: 1500 Representative spoke with at DME Agency: Thedore Mins HH Arranged: PT, OT [place] Agency: [place] Date [place]: 11/21/18 Time [place]: 1300 Representative spoke with at [place]: [place]) Interventions     Readmission Risk Interventions Readmission Risk Prevention Plan 11/21/2018  Post  Dischage Appt Complete  Medication Screening Complete  Transportation Screening Complete  Some recent data might be hidden

## 2018-11-21 NOTE — Discharge Instructions (Signed)

## 2018-11-22 DIAGNOSIS — Z981 Arthrodesis status: Secondary | ICD-10-CM | POA: Diagnosis not present

## 2018-11-22 DIAGNOSIS — Z4789 Encounter for other orthopedic aftercare: Secondary | ICD-10-CM | POA: Diagnosis not present

## 2018-11-22 DIAGNOSIS — F17210 Nicotine dependence, cigarettes, uncomplicated: Secondary | ICD-10-CM | POA: Diagnosis not present

## 2018-11-22 DIAGNOSIS — I10 Essential (primary) hypertension: Secondary | ICD-10-CM | POA: Diagnosis not present

## 2018-11-22 DIAGNOSIS — Z9181 History of falling: Secondary | ICD-10-CM | POA: Diagnosis not present

## 2018-11-22 DIAGNOSIS — Z452 Encounter for adjustment and management of vascular access device: Secondary | ICD-10-CM | POA: Diagnosis not present

## 2018-11-22 DIAGNOSIS — F329 Major depressive disorder, single episode, unspecified: Secondary | ICD-10-CM | POA: Diagnosis not present

## 2018-11-22 DIAGNOSIS — M170 Bilateral primary osteoarthritis of knee: Secondary | ICD-10-CM | POA: Diagnosis not present

## 2018-11-22 DIAGNOSIS — M4316 Spondylolisthesis, lumbar region: Secondary | ICD-10-CM | POA: Diagnosis not present

## 2018-11-22 DIAGNOSIS — I2510 Atherosclerotic heart disease of native coronary artery without angina pectoris: Secondary | ICD-10-CM | POA: Diagnosis not present

## 2018-11-22 DIAGNOSIS — R0902 Hypoxemia: Secondary | ICD-10-CM | POA: Diagnosis not present

## 2018-11-22 DIAGNOSIS — E0500 Thyrotoxicosis with diffuse goiter without thyrotoxic crisis or storm: Secondary | ICD-10-CM | POA: Diagnosis not present

## 2018-11-22 DIAGNOSIS — Z79899 Other long term (current) drug therapy: Secondary | ICD-10-CM | POA: Diagnosis not present

## 2018-11-22 DIAGNOSIS — Z6841 Body Mass Index (BMI) 40.0 and over, adult: Secondary | ICD-10-CM | POA: Diagnosis not present

## 2018-11-22 LAB — TYPE AND SCREEN
ABO/RH(D): O POS
Antibody Screen: POSITIVE
Donor AG Type: NEGATIVE
Donor AG Type: NEGATIVE
PT AG Type: NEGATIVE
Unit division: 0
Unit division: 0

## 2018-11-22 LAB — BPAM RBC
Blood Product Expiration Date: 202011022359
Blood Product Expiration Date: 202011022359
Unit Type and Rh: 5100
Unit Type and Rh: 5100

## 2018-12-19 ENCOUNTER — Other Ambulatory Visit (HOSPITAL_COMMUNITY): Payer: Self-pay | Admitting: Neurosurgery

## 2018-12-19 ENCOUNTER — Other Ambulatory Visit: Payer: Self-pay | Admitting: Neurosurgery

## 2018-12-19 DIAGNOSIS — M4316 Spondylolisthesis, lumbar region: Secondary | ICD-10-CM

## 2018-12-20 ENCOUNTER — Other Ambulatory Visit: Payer: Self-pay

## 2018-12-20 ENCOUNTER — Ambulatory Visit (HOSPITAL_COMMUNITY)
Admission: RE | Admit: 2018-12-20 | Discharge: 2018-12-20 | Disposition: A | Discharge status: Home / Self Care | Payer: Medicare Other | Source: Ambulatory Visit | Attending: Neurosurgery | Admitting: Neurosurgery

## 2018-12-20 DIAGNOSIS — M4316 Spondylolisthesis, lumbar region: Secondary | ICD-10-CM | POA: Diagnosis not present

## 2018-12-20 DIAGNOSIS — M4326 Fusion of spine, lumbar region: Secondary | ICD-10-CM | POA: Diagnosis not present

## 2018-12-20 MED ORDER — GADOBUTROL 1 MMOL/ML IV SOLN
10.0000 mL | Freq: Once | INTRAVENOUS | Status: AC | PRN
  Administered 2018-12-20: 10 mL via INTRAVENOUS

## 2018-12-22 DIAGNOSIS — Z981 Arthrodesis status: Secondary | ICD-10-CM | POA: Diagnosis not present

## 2018-12-22 DIAGNOSIS — I10 Essential (primary) hypertension: Secondary | ICD-10-CM | POA: Diagnosis not present

## 2018-12-22 DIAGNOSIS — Z6841 Body Mass Index (BMI) 40.0 and over, adult: Secondary | ICD-10-CM | POA: Diagnosis not present

## 2018-12-22 DIAGNOSIS — R0902 Hypoxemia: Secondary | ICD-10-CM | POA: Diagnosis not present

## 2018-12-22 DIAGNOSIS — E0500 Thyrotoxicosis with diffuse goiter without thyrotoxic crisis or storm: Secondary | ICD-10-CM | POA: Diagnosis not present

## 2018-12-22 DIAGNOSIS — Z4789 Encounter for other orthopedic aftercare: Secondary | ICD-10-CM | POA: Diagnosis not present

## 2018-12-22 DIAGNOSIS — Z9181 History of falling: Secondary | ICD-10-CM | POA: Diagnosis not present

## 2018-12-22 DIAGNOSIS — F329 Major depressive disorder, single episode, unspecified: Secondary | ICD-10-CM | POA: Diagnosis not present

## 2018-12-22 DIAGNOSIS — M4316 Spondylolisthesis, lumbar region: Secondary | ICD-10-CM | POA: Diagnosis not present

## 2018-12-22 DIAGNOSIS — F17210 Nicotine dependence, cigarettes, uncomplicated: Secondary | ICD-10-CM | POA: Diagnosis not present

## 2018-12-22 DIAGNOSIS — I2510 Atherosclerotic heart disease of native coronary artery without angina pectoris: Secondary | ICD-10-CM | POA: Diagnosis not present

## 2018-12-22 DIAGNOSIS — Z79899 Other long term (current) drug therapy: Secondary | ICD-10-CM | POA: Diagnosis not present

## 2018-12-22 DIAGNOSIS — Z452 Encounter for adjustment and management of vascular access device: Secondary | ICD-10-CM | POA: Diagnosis not present

## 2018-12-22 DIAGNOSIS — M170 Bilateral primary osteoarthritis of knee: Secondary | ICD-10-CM | POA: Diagnosis not present

## 2018-12-23 DIAGNOSIS — R0902 Hypoxemia: Secondary | ICD-10-CM | POA: Diagnosis not present

## 2018-12-23 DIAGNOSIS — I2510 Atherosclerotic heart disease of native coronary artery without angina pectoris: Secondary | ICD-10-CM | POA: Diagnosis not present

## 2018-12-23 DIAGNOSIS — I10 Essential (primary) hypertension: Secondary | ICD-10-CM | POA: Diagnosis not present

## 2018-12-23 DIAGNOSIS — Z452 Encounter for adjustment and management of vascular access device: Secondary | ICD-10-CM | POA: Diagnosis not present

## 2018-12-23 DIAGNOSIS — Z4789 Encounter for other orthopedic aftercare: Secondary | ICD-10-CM | POA: Diagnosis not present

## 2018-12-23 DIAGNOSIS — M4316 Spondylolisthesis, lumbar region: Secondary | ICD-10-CM | POA: Diagnosis not present

## 2018-12-30 DIAGNOSIS — M5416 Radiculopathy, lumbar region: Secondary | ICD-10-CM | POA: Diagnosis not present

## 2019-02-23 ENCOUNTER — Other Ambulatory Visit: Payer: Medicare Other

## 2019-02-26 ENCOUNTER — Ambulatory Visit: Payer: Medicare Other | Admitting: Endocrinology

## 2019-03-13 ENCOUNTER — Telehealth: Payer: Self-pay

## 2019-03-13 NOTE — Telephone Encounter (Signed)
Patient called and needs a refill on her Thyroid medication.  She is down to 2 pills.  The hospital had changed her synthroid to 137 mcg and she would like it sent to   [facility], [place] (Ph: [phone])  She has made an appointment for labs on 03-24-19 and f/u appointment 03-30-19

## 2019-03-16 ENCOUNTER — Other Ambulatory Visit: Payer: Self-pay | Admitting: Endocrinology

## 2019-03-16 ENCOUNTER — Other Ambulatory Visit: Payer: Self-pay

## 2019-03-16 MED ORDER — LEVOTHYROXINE SODIUM 137 MCG PO TABS: 137.0000 ug | ORAL_TABLET | Freq: Every day | ORAL | 0 refills | Status: DC

## 2019-03-16 NOTE — Telephone Encounter (Signed)
Rx sent 

## 2019-03-17 ENCOUNTER — Ambulatory Visit: Payer: Self-pay

## 2019-03-17 ENCOUNTER — Encounter: Payer: Self-pay | Admitting: Orthopaedic Surgery

## 2019-03-17 ENCOUNTER — Other Ambulatory Visit: Payer: Self-pay

## 2019-03-17 ENCOUNTER — Ambulatory Visit (INDEPENDENT_AMBULATORY_CARE_PROVIDER_SITE_OTHER): Payer: Medicare Other | Admitting: Orthopaedic Surgery

## 2019-03-17 ENCOUNTER — Ambulatory Visit (INDEPENDENT_AMBULATORY_CARE_PROVIDER_SITE_OTHER): Payer: Medicare Other

## 2019-03-17 VITALS — Ht 65.0 in | Wt 288.0 lb

## 2019-03-17 DIAGNOSIS — M170 Bilateral primary osteoarthritis of knee: Secondary | ICD-10-CM

## 2019-03-17 NOTE — Progress Notes (Signed)
Office Visit Note   Patient: Tonya Greene           Date of Birth: 02-23-1970           MRN: [id number] Visit Date: 03/17/2019              Requested by: Lin Landsman, [place],  [place] [address] PCP: Lin Landsman, MD   Assessment & Plan: Visit Diagnoses:  1. Bilateral primary osteoarthritis of knee     Plan: Impression is bilateral knee advanced degenerative joint disease and unexplained lower extremity muscle spasms.  We will refer the patient to Dr. Junius Roads for ultrasound-guided cortisone injections to both knees.  In regards to the muscle spasms, we will wait and see if the knee injections and subsequent pain relief help.  If these do not help with the spasms, she will let us know and we will refer her to neurology.  Otherwise, follow-up with [place] as needed. Total face to face encounter time was greater than 25 minutes and over half of this time was spent in counseling and/or coordination of care.  Follow-Up Instructions: Return if symptoms worsen or fail to improve.   Orders:  Orders Placed This Encounter  Procedures  . XR Knee 1-2 Views Left  . XR Knee 1-2 Views Right   No orders of the defined types were placed in this encounter.     Procedures: No procedures performed   Clinical Data: No additional findings.   Subjective: Chief Complaint  Patient presents with  . Right Knee - Pain  . Left Knee - Pain    HPI Tonya Greene is a 49 year old female who comes in today with bilateral knee pain and continued muscle spasms left greater than right.  History of spondylosis with myelopathy which subsequently ended in fusion by Dr. Trenton Gammon this past October 2020.  This did not seem to significantly help her lower extremity muscle spasms.  She notes that her spasms started left lower extremity radiate into the abdominal area and then down the right lower extremity.  These are constant with no specific aggravators.  She denies any new numbness, tingling or burning.   She has not seen a neurologist.  Review of Systems as detailed in HPI.  All others reviewed and are negative.   Objective: Vital Signs: There were no vitals taken for this visit.  Physical Exam well-developed well-nourished female in no acute distress.  Alert and oriented x3.  Ortho Exam examination of both knees reveals exogenous obesity.  Valgus deformity left greater than right.  Range of motion 0 to 90 degrees.  Medial and lateral joint line tenderness.  Specialty Comments:  No specialty comments available.  Imaging: XR Knee 1-2 Views Left  Result Date: 03/17/2019 X-rays of the left knee show near bone-on-bone lateral patellofemoral compartments  XR Knee 1-2 Views Right  Result Date: 03/17/2019 X-rays of the right knee show moderate tricompartmental degenerative changes    PMFS History: Patient Active Problem List   Diagnosis Date Noted  . Hypoxemia   . Surgery, elective   . S/P PICC central line placement   . Degenerative spondylolisthesis 11/14/2018  . S/P lumbar fusion 11/14/2018  . 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 GERD (gastroesophageal reflux disease) 07/24/2016  . Ganglion cyst of right foot 04/12/2015  . Graves disease 10/29/2013  . Benign essential HTN 10/29/2013  . Lumbar disc disease 10/29/2013  . Morbid obesity with BMI of 45.0-49.9, adult ([place]) 10/29/2013   Past Medical History:  Diagnosis Date  . Arthritis   . Depression   . Disc disease with myelopathy, lumbar   . Edema    bilateral lower extremities  . GERD (gastroesophageal reflux disease)   . Graves disease   . Hypertension   . Muscle spasms of both lower extremities   . Sleep apnea   . Stomach ulcer   . Thyroid cancer ([place])     Family History  Problem Relation Age of Onset  . Diabetes Mother   . Hypertension Mother   . Diabetes Father   . Hypertension Father   . Heart failure Father     Past Surgical History:  Procedure Laterality Date  . CARPAL TUNNEL RELEASE Right   .  COLONOSCOPY     repair of fissures  . glands     under arms-removed  . TONSILLECTOMY     Social History   Occupational History  . Not on file  Tobacco Use  . Smoking status: Current Every Day Smoker    Packs/day: 0.50    Years: 35.00    Pack years: 17.50    Types: Cigarettes  . Smokeless tobacco: Never Used  Substance and Sexual Activity  . Alcohol use: Yes  . Drug use: Yes    Types: Marijuana    Comment: 2x/daily  . Sexual activity: Yes    Birth control/protection: Condom

## 2019-03-18 ENCOUNTER — Ambulatory Visit: Payer: Self-pay

## 2019-03-18 ENCOUNTER — Ambulatory Visit (INDEPENDENT_AMBULATORY_CARE_PROVIDER_SITE_OTHER): Payer: Medicare Other | Admitting: Family Medicine

## 2019-03-18 ENCOUNTER — Encounter: Payer: Self-pay | Admitting: Family Medicine

## 2019-03-18 DIAGNOSIS — M170 Bilateral primary osteoarthritis of knee: Secondary | ICD-10-CM

## 2019-03-18 DIAGNOSIS — M1711 Unilateral primary osteoarthritis, right knee: Secondary | ICD-10-CM

## 2019-03-18 DIAGNOSIS — M1712 Unilateral primary osteoarthritis, left knee: Secondary | ICD-10-CM | POA: Diagnosis not present

## 2019-03-18 NOTE — Progress Notes (Signed)
Subjective: She is here for planned bilateral ultrasound bilateral ultrasound-guided knee injections for osteoarthritis.  She has super morbid obesity with body habitus that makes blind injection very difficult.  Objective: She is very tender around her knees with no erythema or warmth.  Procedure: Ultrasound-guided bilateral knee steroid injections: After sterile prep with Betadine, injected 3 cc 1% lidocaine without epinephrine and 40 mg methylprednisolone using a 22-gauge spinal needle passing the needle into the superior lateral joint recess without complication.  Clear yellow synovial fluid was aspirated into the syringe to confirm intra-articular placement bilaterally.

## 2019-03-18 NOTE — Progress Notes (Signed)
ult

## 2019-03-24 ENCOUNTER — Other Ambulatory Visit (INDEPENDENT_AMBULATORY_CARE_PROVIDER_SITE_OTHER): Payer: Medicare Other

## 2019-03-24 ENCOUNTER — Other Ambulatory Visit: Payer: Self-pay

## 2019-03-24 DIAGNOSIS — E890 Postprocedural hypothyroidism: Secondary | ICD-10-CM

## 2019-03-24 LAB — T4, FREE: Free T4: 1.09 ng/dL (ref 0.60–1.60)

## 2019-03-24 LAB — TSH: TSH: 3.91 u[IU]/mL (ref 0.35–4.50)

## 2019-03-30 ENCOUNTER — Other Ambulatory Visit: Payer: Self-pay

## 2019-03-30 ENCOUNTER — Encounter: Payer: Self-pay | Admitting: Endocrinology

## 2019-03-30 ENCOUNTER — Ambulatory Visit (INDEPENDENT_AMBULATORY_CARE_PROVIDER_SITE_OTHER): Payer: Medicare Other | Admitting: Endocrinology

## 2019-03-30 VITALS — BP 124/80 | HR 111 | Ht 65.0 in | Wt 283.0 lb

## 2019-03-30 DIAGNOSIS — E890 Postprocedural hypothyroidism: Secondary | ICD-10-CM

## 2019-03-30 LAB — T4, FREE: Free T4: 0.87 ng/dL (ref 0.60–1.60)

## 2019-03-30 LAB — TSH: TSH: 1.52 u[IU]/mL (ref 0.35–4.50)

## 2019-03-30 MED ORDER — LEVOTHYROXINE SODIUM 150 MCG PO TABS: 150.0000 ug | ORAL_TABLET | Freq: Every day | ORAL | 2 refills | Status: DC

## 2019-03-30 NOTE — Progress Notes (Signed)
Patient ID: Tonya Greene, female   DOB: Oct 19, 1970, 49 y.o.   MRN: [id number]              Reason for Appointment:  Hypothyroidism, follow-up visit     History of Present Illness:   Hypothyroidism was first diagnosed in 1996 Prior to this she had Graves' disease treated with I-131 for her hyperthyroidism but details of this are not available She thinks she was started on levothyroxine after thyroid ablation Again no prior records are available except since 2015 when she moved back here  She has been followed periodically by her PCP and has been mostly taking 175 g of levothyroxine Review of her labs show low normal TSH in 2015 and 2016 but mild increase in 2017, further increased to 11.77 in 6/18  Recent history: She had previously been given doses up to 200 g before her initial visit in August 2018 However with this her TSH was low at 0.18  Her levothyroxine doses were progressively reduced in 2019 She was continuing to be taking only 137 g daily on her follow-up in 7/20  However during her hospitalization in 10/20 her TSH level was high as also in 8/20 in the ER She was discharged on 150 mcg of levothyroxine but no follow-up labs were done  She had requested a refill for levothyroxine couple of weeks ago but her 137 mcg prescription was refilled instead of 150 She thinks that with reducing the dose again her energy level has gone down and she is feeling less alert and more tired Does not feel unusually cold Her weight is slightly better, previously also was having issues with edema  She still take her levothyroxine very consistently at least an hour before breakfast without any iron or calcium  Her TSH is recently upper normal, had been on 137 mcg for about a week         Patient's weight history is as follows:  Wt Readings from Last 3 Encounters:  03/30/19 283 lb (128.4 kg)  03/17/19 288 lb (130.6 kg)  11/14/18 (!) 321 lb (145.6 kg)    Thyroid function results  have been as follows:  Lab Results  Component Value Date   TSH 3.91 03/24/2019   TSH 13.702 (H) 11/15/2018   TSH 11.237 (H) 10/02/2018   TSH 0.907 08/04/2018   FREET4 1.09 03/24/2019   FREET4 1.11 11/15/2018   FREET4 0.88 10/03/2018   FREET4 1.27 08/06/2017   T3FREE 1.5 (L) 11/15/2018   T3FREE 3.4 07/24/2016     Past Medical History:  Diagnosis Date  . Arthritis   . Depression   . Disc disease with myelopathy, lumbar   . Edema    bilateral lower extremities  . GERD (gastroesophageal reflux disease)   . Graves disease   . Hypertension   . Muscle spasms of both lower extremities   . Sleep apnea   . Stomach ulcer   . Thyroid cancer [facility])     Past Surgical History:  Procedure Laterality Date  . CARPAL TUNNEL RELEASE Right   . COLONOSCOPY     repair of fissures  . glands     under arms-removed  . TONSILLECTOMY      Family History  Problem Relation Age of Onset  . Diabetes Mother   . Hypertension Mother   . Diabetes Father   . Hypertension Father   . Heart failure Father     Social History:  reports that she has been smoking cigarettes. She has  a 17.50 pack-year smoking history. She has never used smokeless tobacco. She reports current alcohol use. She reports current drug use. Drug: Marijuana.  Allergies:  Allergies  Allergen Reactions  . Tape     Tears skin    Allergies as of 03/30/2019      Reactions   Tape    Tears skin      Medication List       Accurate as of March 30, 2019  3:16 PM. If you have any questions, ask your nurse or doctor.        albuterol 108 (90 Base) MCG/ACT inhaler Commonly known as: VENTOLIN HFA Inhale 2 puffs into the lungs every 6 (six) hours as needed for wheezing or shortness of breath.   cetirizine 10 MG tablet Commonly known as: ZYRTEC Take 1 tablet (10 mg total) by mouth daily. Need annual visit for further refills What changed: additional instructions   diazepam 5 MG tablet Commonly known as: VALIUM Take  1-2 tablets (5-10 mg total) by mouth every 6 (six) hours as needed for muscle spasms.   famotidine 20 MG tablet Commonly known as: PEPCID Take 20 mg by mouth 2 (two) times daily.   furosemide 40 MG tablet Commonly known as: Lasix Take 1 tablet (40 mg total) by mouth 2 (two) times daily.   levothyroxine 137 MCG tablet Commonly known as: SYNTHROID Take 1 tablet (137 mcg total) by mouth daily before breakfast.   lisinopril 10 MG tablet Commonly known as: ZESTRIL Take 1 tablet (10 mg total) by mouth daily.   NIFEdipine 60 MG 24 hr tablet Commonly known as: ADALAT CC TAKE 1 TABLET BY MOUTH DAILY   Oxycodone HCl 10 MG Tabs Take 1 tablet (10 mg total) by mouth every 3 (three) hours as needed for severe pain ((score 7 to 10)).   potassium chloride 10 MEQ tablet Commonly known as: KLOR-CON Take 2 tablets (20 mEq total) by mouth 2 (two) times daily.   promethazine 6.25 MG/5ML syrup Commonly known as: PHENERGAN Take 10 mLs by mouth 3 (three) times daily.          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 She had been requiring variable doses of levothyroxine supplement  Even though she had normal levels of thyroid with 137 mcg previously in 7/20 her TSH subsequently was higher in 2020 She has been on 150 mcg after discharge from the hospital She had felt fairly good with this, now for the last 2 weeks with taking 137 mcg she is getting fatigued again   MENOPAUSAL symptoms: Improved   PLAN:   She will go back to the 150 mcg daily of levothyroxine In the meantime she can finish off her 137 dose by  taking extra half tablet twice a week  Follow-up in 3 months  Trysten Berti 03/30/2019, 3:16 PM     Note: This office note was prepared with Dragon voice recognition system technology. Any transcriptional errors that result from this process are unintentional.

## 2019-03-30 NOTE — Patient Instructions (Addendum)
Take 1 1/2 pills of 137ug twice a week till gone then 150 daily

## 2019-04-22 ENCOUNTER — Other Ambulatory Visit: Payer: Self-pay

## 2019-04-22 MED ORDER — LEVOTHYROXINE SODIUM 150 MCG PO TABS: 150.0000 ug | ORAL_TABLET | Freq: Every day | ORAL | 2 refills | Status: DC

## 2019-04-28 ENCOUNTER — Other Ambulatory Visit: Payer: Self-pay

## 2019-04-28 MED ORDER — LEVOTHYROXINE SODIUM 150 MCG PO TABS: 150.0000 ug | ORAL_TABLET | Freq: Every day | ORAL | 2 refills | Status: DC

## 2019-06-16 ENCOUNTER — Other Ambulatory Visit: Payer: Self-pay | Admitting: Endocrinology

## 2019-06-24 ENCOUNTER — Other Ambulatory Visit: Payer: Self-pay | Admitting: Endocrinology

## 2019-06-24 DIAGNOSIS — E890 Postprocedural hypothyroidism: Secondary | ICD-10-CM

## 2019-06-25 ENCOUNTER — Other Ambulatory Visit: Payer: Medicare Other

## 2019-06-29 ENCOUNTER — Other Ambulatory Visit: Payer: Self-pay

## 2019-06-29 ENCOUNTER — Ambulatory Visit: Payer: Medicare Other | Admitting: Endocrinology

## 2019-06-29 ENCOUNTER — Other Ambulatory Visit (INDEPENDENT_AMBULATORY_CARE_PROVIDER_SITE_OTHER): Payer: Medicare Other

## 2019-06-29 DIAGNOSIS — E890 Postprocedural hypothyroidism: Secondary | ICD-10-CM

## 2019-06-29 LAB — T4, FREE: Free T4: 1.21 ng/dL (ref 0.60–1.60)

## 2019-06-29 LAB — TSH: TSH: 0.34 u[IU]/mL — ABNORMAL LOW (ref 0.35–4.50)

## 2019-07-03 ENCOUNTER — Ambulatory Visit: Payer: Medicare Other | Admitting: Endocrinology

## 2019-07-09 ENCOUNTER — Ambulatory Visit: Payer: Medicare Other | Admitting: Endocrinology

## 2019-07-21 ENCOUNTER — Encounter: Payer: Self-pay | Admitting: Endocrinology

## 2019-07-21 ENCOUNTER — Other Ambulatory Visit: Payer: Self-pay

## 2019-07-21 ENCOUNTER — Ambulatory Visit (INDEPENDENT_AMBULATORY_CARE_PROVIDER_SITE_OTHER): Payer: Medicare Other | Admitting: Endocrinology

## 2019-07-21 VITALS — BP 118/74 | HR 91 | Ht 65.0 in | Wt 292.4 lb

## 2019-07-21 DIAGNOSIS — E890 Postprocedural hypothyroidism: Secondary | ICD-10-CM

## 2019-07-21 NOTE — Progress Notes (Signed)
Patient ID: Tonya Greene, female   DOB: [birth year], 49 y.o.   MRN: [id number]              Reason for Appointment:  Hypothyroidism, follow-up visit     History of Present Illness:   Hypothyroidism was first diagnosed in 1996 Prior to this she had Graves' disease treated with I-131 for her hyperthyroidism but details of this are not available She thinks she was started on levothyroxine after thyroid ablation Again no prior records are available except since 2015 when she moved back here  She has been followed periodically by her PCP and has been mostly taking 175 g of levothyroxine Review of her labs show low normal TSH in 2015 and 2016 but mild increase in 2017, further increased to 11.77 in 6/18  Recent history: She had previously been given doses up to 200 g before her initial visit in August 2018 However with this her TSH was low at 0.18  Her levothyroxine doses were progressively reduced in 2019 She was continuing to be taking only 137 g daily on her follow-up in 7/20  However during her hospitalization in 10/20 her TSH level was high as also in 8/20 in the ER She was discharged on 150 mcg of levothyroxine but since she had only 137 mcg at home she was on this for couple of weeks prior to her visit in 2/21  She is coming back now for follow-up with taking 150 mcg levothyroxine, previously was having fatigue with the 137 mcg dose With 150 mcg she has generally better energy although has several other problems intermittently  Her weight is slightly higher, she thinks she is having issues with edema  She still take her levothyroxine very regularly at least an hour before breakfast without any iron or calcium   Her TSH which was previously normal is now low normal at 0.34         Patient's weight history is as follows:  Wt Readings from Last 3 Encounters:  07/21/19 292 lb 6.4 oz (132.6 kg)  03/30/19 283 lb (128.4 kg)  03/17/19 288 lb (130.6 kg)    Thyroid function  results have been as follows:  Lab Results  Component Value Date   TSH 0.34 (L) 06/29/2019   TSH 1.52 03/30/2019   TSH 3.91 03/24/2019   TSH 13.702 (H) 11/15/2018   FREET4 1.21 06/29/2019   FREET4 0.87 03/30/2019   FREET4 1.09 03/24/2019   FREET4 1.11 11/15/2018   T3FREE 1.5 (L) 11/15/2018   T3FREE 3.4 07/24/2016     Past Medical History:  Diagnosis Date  . Arthritis   . Depression   . Disc disease with myelopathy, lumbar   . Edema    bilateral lower extremities  . GERD (gastroesophageal reflux disease)   . Graves disease   . Hypertension   . Muscle spasms of both lower extremities   . Sleep apnea   . Stomach ulcer   . Thyroid cancer [facility])     Past Surgical History:  Procedure Laterality Date  . CARPAL TUNNEL RELEASE Right   . COLONOSCOPY     repair of fissures  . glands     under arms-removed  . TONSILLECTOMY      Family History  Problem Relation Age of Onset  . Diabetes Mother   . Hypertension Mother   . Diabetes Father   . Hypertension Father   . Heart failure Father     Social History:  reports that she has been  smoking cigarettes. She has a 17.50 pack-year smoking history. She has never used smokeless tobacco. She reports current alcohol use. She reports current drug use. Drug: Marijuana.  Allergies:  Allergies  Allergen Reactions  . Tape     Tears skin    Allergies as of 07/21/2019      Reactions   Tape    Tears skin      Medication List       Accurate as of July 21, 2019  3:24 PM. If you have any questions, ask your nurse or doctor.        albuterol 108 (90 Base) MCG/ACT inhaler Commonly known as: VENTOLIN HFA Inhale 2 puffs into the lungs every 6 (six) hours as needed for wheezing or shortness of breath.   cetirizine 10 MG tablet Commonly known as: ZYRTEC Take 1 tablet (10 mg total) by mouth daily. Need annual visit for further refills What changed: additional instructions   diazepam 5 MG tablet Commonly known as: VALIUM Take  1-2 tablets (5-10 mg total) by mouth every 6 (six) hours as needed for muscle spasms.   famotidine 20 MG tablet Commonly known as: PEPCID Take 20 mg by mouth 2 (two) times daily.   furosemide 40 MG tablet Commonly known as: Lasix Take 1 tablet (40 mg total) by mouth 2 (two) times daily.   levothyroxine 150 MCG tablet Commonly known as: SYNTHROID TAKE 1 TABLET (150 MCG TOTAL) BY MOUTH DAILY BEFORE BREAKFAST.   lisinopril 10 MG tablet Commonly known as: ZESTRIL Take 1 tablet (10 mg total) by mouth daily.   NIFEdipine 60 MG 24 hr tablet Commonly known as: ADALAT CC TAKE 1 TABLET BY MOUTH DAILY   Oxycodone HCl 10 MG Tabs Take 1 tablet (10 mg total) by mouth every 3 (three) hours as needed for severe pain ((score 7 to 10)).   potassium chloride 10 MEQ tablet Commonly known as: KLOR-CON Take 2 tablets (20 mEq total) by mouth 2 (two) times daily.   promethazine 6.25 MG/5ML syrup Commonly known as: PHENERGAN Take 10 mLs by mouth 3 (three) times daily.          Review of Systems  Has mild hot flashes and sweating spells  Takes Lasix for edema        Examination:    BP 118/74 (BP Location: Left Arm, Patient Position: Sitting, Cuff Size: Large)   Pulse 91   Ht 5\' 5"  (1.651 m)   Wt 292 lb 6.4 oz (132.6 kg)   SpO2 99%   BMI 48.66 kg/m   Biceps reflexes show normal relaxation   Assessment:  HYPOTHYROIDISM, 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 She had been requiring variable doses of levothyroxine   She has been on 150 mcg after her last visit in 2/21 No complaints of fatigue currently Her weight fluctuates she does not have any other typical symptoms of hypothyroidism However TSH is low normal at 0.34  MENOPAUSAL symptoms: Improved   PLAN:   She will go down to 6-1/2 tablets of the 150 mcg levothyroxine as her TSH is trending lower Reminded her to take her levothyroxine on empty stomach without any iron or calcium-containing  vitamins  Follow-up in 6 months, to call if she has any unusual fatigue  [name] 07/21/2019, 3:24 PM     Note: This office note was prepared with Dragon voice recognition system technology. Any transcriptional errors that result from this process are unintentional.

## 2019-07-21 NOTE — Patient Instructions (Signed)
Thyroid pills, take 1 daily but 1/2 on Sundays

## 2019-09-22 NOTE — Progress Notes (Signed)
Error

## 2019-10-29 ENCOUNTER — Other Ambulatory Visit: Payer: Self-pay | Admitting: Endocrinology

## 2020-01-18 ENCOUNTER — Telehealth: Payer: Self-pay | Admitting: Orthopaedic Surgery

## 2020-01-18 NOTE — Telephone Encounter (Signed)
Pt called stating she needs a note explaining she can't be on her knee for a long time and the overall status of her knee to give to court and she also mentioned she would like to get authorized for bilateral knee injections; I did let the pt know we aren't sending in authorizations until 02/09/2020. Pt states she would like to pick the letter up on 01/19/20 if possible  [phone]

## 2020-01-18 NOTE — Telephone Encounter (Signed)
Letter is fine by me.  It can just state that she is severe DJD and that she should avoid any prolonged standing or activity.  Last time we had Hilts do the injections because of her size so she just needs an appt with him.  Thanks.

## 2020-01-18 NOTE — Telephone Encounter (Signed)
Please advise 

## 2020-01-19 ENCOUNTER — Other Ambulatory Visit: Payer: Self-pay

## 2020-01-19 ENCOUNTER — Telehealth: Payer: Self-pay

## 2020-01-19 ENCOUNTER — Other Ambulatory Visit (INDEPENDENT_AMBULATORY_CARE_PROVIDER_SITE_OTHER): Payer: Medicare Other

## 2020-01-19 DIAGNOSIS — E890 Postprocedural hypothyroidism: Secondary | ICD-10-CM

## 2020-01-19 LAB — TSH: TSH: 3.04 u[IU]/mL (ref 0.35–4.50)

## 2020-01-19 LAB — T4, FREE: Free T4: 0.78 ng/dL (ref 0.60–1.60)

## 2020-01-19 NOTE — Telephone Encounter (Signed)
Noted! Thank you

## 2020-01-19 NOTE — Telephone Encounter (Signed)
Patient needs to be precerted for bilateral visco injections starting after 02/09/2019. This is Dr.Xu's patient. They will need to be scheduled with Dr.Hilts.

## 2020-01-19 NOTE — Telephone Encounter (Signed)
Spoke with patient. She is aware that note is up front for pick up. She has been scheduled with Dr.Hilts for bilateral cortisone injections. I have sent for authorization for bilateral visco injections that will start next year.

## 2020-01-20 ENCOUNTER — Ambulatory Visit (INDEPENDENT_AMBULATORY_CARE_PROVIDER_SITE_OTHER): Payer: Medicare Other | Admitting: Family Medicine

## 2020-01-20 ENCOUNTER — Ambulatory Visit: Payer: Self-pay

## 2020-01-20 DIAGNOSIS — M1711 Unilateral primary osteoarthritis, right knee: Secondary | ICD-10-CM

## 2020-01-20 DIAGNOSIS — M1712 Unilateral primary osteoarthritis, left knee: Secondary | ICD-10-CM

## 2020-01-20 NOTE — Progress Notes (Signed)
Office Visit Note   Patient: Tonya Greene           Date of Birth: 1970-04-01           MRN: [id number] Visit Date: 01/20/2020 Requested by: Lin Landsman, [address] [place],  [place] [address] PCP: Lin Landsman, MD  Subjective: Chief Complaint  Patient presents with  . Right Knee - Pain    Bilateral cortisone injections with u/s guidance per Dr. Erlinda Hong  . Left Knee - Pain    HPI: She is here with bilateral knee pain.  Last ultrasound-guided steroid injections in February helped.  Notably, may be a week.  She is awaiting approval for gel injections but would like to have cortisone since her pain has gotten steadily worse.              ROS:   All other systems were reviewed and are negative.  Objective: Vital Signs: There were no vitals taken for this visit.  Physical Exam:  General:  Alert and oriented, in no acute distress. Pulm:  Breathing unlabored. Psy:  Normal mood, congruent affect  Knees: Difficult to determine whether she has an effusion.  No warmth or erythema.  Imaging: US Guided Needle Placement - No Linked Charges  Result Date: 01/20/2020 Ultrasound-guided bilateral knee injections: After sterile prep with Betadine, injected 3 cc 0.25% bupivacaine and 6 mg betamethasone into the superior lateral joint recess.   Assessment & Plan: 1.  Bilateral knee osteoarthritis -Ultrasound-guided steroid injections today.  Viscosupplementation in the future.     Procedures: No procedures performed        PMFS History: Patient Active Problem List   Diagnosis Date Noted  . Hypoxemia   . Surgery, elective   . S/P PICC central line placement   . Degenerative spondylolisthesis 11/14/2018  . S/P lumbar fusion 11/14/2018  . 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 GERD (gastroesophageal reflux disease) 07/24/2016  . Ganglion cyst of right foot 04/12/2015  . Graves disease 10/29/2013  . Benign essential HTN 10/29/2013  . Lumbar disc disease 10/29/2013  .  Morbid obesity with BMI of 45.0-49.9, adult ([facility]) 10/29/2013   Past Medical History:  Diagnosis Date  . Arthritis   . Depression   . Disc disease with myelopathy, lumbar   . Edema    bilateral lower extremities  . GERD (gastroesophageal reflux disease)   . Graves disease   . Hypertension   . Muscle spasms of both lower extremities   . Sleep apnea   . Stomach ulcer   . Thyroid cancer ([place])     Family History  Problem Relation Age of Onset  . Diabetes Mother   . Hypertension Mother   . Diabetes Father   . Hypertension Father   . Heart failure Father     Past Surgical History:  Procedure Laterality Date  . CARPAL TUNNEL RELEASE Right   . COLONOSCOPY     repair of fissures  . glands     under arms-removed  . TONSILLECTOMY     Social History   Occupational History  . Not on file  Tobacco Use  . Smoking status: Current Every Day Smoker    Packs/day: 0.50    Years: 35.00    Pack years: 17.50    Types: Cigarettes  . Smokeless tobacco: Never Used  Vaping Use  . Vaping Use: Never used  Substance and Sexual Activity  . Alcohol use: Yes  . Drug use: Yes    Types: Marijuana  Comment: 2x/daily  . Sexual activity: Yes    Birth control/protection: Condom

## 2020-01-21 ENCOUNTER — Ambulatory Visit (INDEPENDENT_AMBULATORY_CARE_PROVIDER_SITE_OTHER): Payer: Medicare Other | Admitting: Endocrinology

## 2020-01-21 ENCOUNTER — Other Ambulatory Visit: Payer: Self-pay

## 2020-01-21 ENCOUNTER — Encounter: Payer: Self-pay | Admitting: Endocrinology

## 2020-01-21 VITALS — BP 122/78 | HR 108 | Ht 65.0 in | Wt 291.2 lb

## 2020-01-21 DIAGNOSIS — E890 Postprocedural hypothyroidism: Secondary | ICD-10-CM | POA: Diagnosis not present

## 2020-01-21 NOTE — Progress Notes (Signed)
Patient ID: Tonya Greene, female   DOB: 30-Sep-1970, 49 y.o.   MRN: [id number]              Reason for Appointment:  Hypothyroidism, follow-up visit     History of Present Illness:   Hypothyroidism was first diagnosed in 1996 Prior to this she had Graves' disease treated with I-131 for her hyperthyroidism but details of this are not available She thinks she was started on levothyroxine after thyroid ablation Again no prior records are available except since 2015 when she moved back here  She has been followed periodically by her PCP and has been mostly taking 175 g of levothyroxine Review of her labs show low normal TSH in 2015 and 2016 but mild increase in 2017, further increased to 11.77 in 6/18  Recent history: She had previously been given doses up to 200 g before her initial visit in August 2018 However with this her TSH was low at 0.18  Her levothyroxine doses were progressively reduced in 2019  She is coming back now for follow-up with taking 150 mcg levothyroxine, previously was having fatigue with the 137 mcg dose On her last visit she was told to take 6-1/2 tablets a week of the 150 mcg since her TSH was low normal  She has some fatigue at times but not consistently No cold intolerance Weight is about the same  She still take her levothyroxine very regularly at least an hour before breakfast without any iron or calcium  Her TSH is back to normal at 3          Patient's weight history is as follows:  Wt Readings from Last 3 Encounters:  01/21/20 291 lb 3.2 oz (132.1 kg)  07/21/19 292 lb 6.4 oz (132.6 kg)  03/30/19 283 lb (128.4 kg)    Thyroid function results have been as follows:  Lab Results  Component Value Date   TSH 3.04 01/19/2020   TSH 0.34 (L) 06/29/2019   TSH 1.52 03/30/2019   TSH 3.91 03/24/2019   FREET4 0.78 01/19/2020   FREET4 1.21 06/29/2019   FREET4 0.87 03/30/2019   FREET4 1.09 03/24/2019   T3FREE 1.5 (L) 11/15/2018   T3FREE 3.4  07/24/2016     Past Medical History:  Diagnosis Date   Arthritis    Depression    Disc disease with myelopathy, lumbar    Edema    bilateral lower extremities   GERD (gastroesophageal reflux disease)    Graves disease    Hypertension    Muscle spasms of both lower extremities    Sleep apnea    Stomach ulcer    Thyroid cancer ([place])     Past Surgical History:  Procedure Laterality Date   CARPAL TUNNEL RELEASE Right    COLONOSCOPY     repair of fissures   glands     under arms-removed   TONSILLECTOMY      Family History  Problem Relation Age of Onset   Diabetes Mother    Hypertension Mother    Diabetes Father    Hypertension Father    Heart failure Father     Social History:  reports that she has been smoking cigarettes. She has a 17.50 pack-year smoking history. She has never used smokeless tobacco. She reports current alcohol use. She reports current drug use. Drug: Marijuana.  Allergies:  Allergies  Allergen Reactions   Tape     Tears skin    Allergies as of 01/21/2020  Reactions   Tape    Tears skin      Medication List       Accurate as of January 21, 2020  3:00 PM. If you have any questions, ask your nurse or doctor.        albuterol 108 (90 Base) MCG/ACT inhaler Commonly known as: VENTOLIN HFA Inhale 2 puffs into the lungs every 6 (six) hours as needed for wheezing or shortness of breath.   cetirizine 10 MG tablet Commonly known as: ZYRTEC Take 1 tablet (10 mg total) by mouth daily. Need annual visit for further refills What changed: additional instructions   diazepam 5 MG tablet Commonly known as: VALIUM Take 1-2 tablets (5-10 mg total) by mouth every 6 (six) hours as needed for muscle spasms.   famotidine 20 MG tablet Commonly known as: PEPCID Take 20 mg by mouth 2 (two) times daily.   furosemide 40 MG tablet Commonly known as: Lasix Take 1 tablet (40 mg total) by mouth 2 (two) times daily.    levothyroxine 150 MCG tablet Commonly known as: SYNTHROID TAKE 1 TABLET (150 MCG TOTAL) BY MOUTH DAILY BEFORE BREAKFAST.   lisinopril 10 MG tablet Commonly known as: ZESTRIL Take 1 tablet (10 mg total) by mouth daily.   NIFEdipine 60 MG 24 hr tablet Commonly known as: ADALAT CC TAKE 1 TABLET BY MOUTH DAILY   Oxycodone HCl 10 MG Tabs Take 1 tablet (10 mg total) by mouth every 3 (three) hours as needed for severe pain ((score 7 to 10)).   potassium chloride 10 MEQ tablet Commonly known as: KLOR-CON Take 2 tablets (20 mEq total) by mouth 2 (two) times daily.   promethazine 6.25 MG/5ML syrup Commonly known as: PHENERGAN Take 10 mLs by mouth 3 (three) times daily.          Review of Systems    Takes Lasix for edema        Examination:    BP 122/78    Pulse (!) 108    Ht 5\' 5"  (1.651 m)    Wt 291 lb 3.2 oz (132.1 kg)    SpO2 96%    BMI 48.46 kg/m   Leg exam normal Biceps reflexes appear normal   Assessment:  HYPOTHYROIDISM, 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 She had been requiring variable doses of levothyroxine   She has been on 150 mcg, 6-1/2 tablets a week since her last visit Subjectively doing well and TSH is back to normal  Covid vaccine hesitancy: Reassured her about the safety and importance of being protected against the virus, currently the patient is refusing to do this   PLAN:   She will stay on 6-1/2 tablets of the 150 mcg levothyroxine as her TSH is back to normal   Follow-up in 6 months, to call if she has any unusual or persistent fatigue  [name] 01/21/2020, 3:00 PM     Note: This office note was prepared with Dragon voice recognition system technology. Any transcriptional errors that result from this process are unintentional.

## 2020-02-10 ENCOUNTER — Other Ambulatory Visit: Payer: Self-pay | Admitting: Endocrinology

## 2020-02-16 ENCOUNTER — Ambulatory Visit: Payer: Self-pay

## 2020-02-16 ENCOUNTER — Ambulatory Visit (INDEPENDENT_AMBULATORY_CARE_PROVIDER_SITE_OTHER): Payer: Medicare Other | Admitting: Orthopaedic Surgery

## 2020-02-16 DIAGNOSIS — M62838 Other muscle spasm: Secondary | ICD-10-CM

## 2020-02-16 DIAGNOSIS — M79605 Pain in left leg: Secondary | ICD-10-CM | POA: Diagnosis not present

## 2020-02-16 NOTE — Progress Notes (Signed)
Office Visit Note   Patient: Tonya Greene           Date of Birth: [birth year]           MRN: [id number] Visit Date: 02/16/2020              Requested by: Lin Landsman, [address] [place],  [place] [id number] PCP: Lin Landsman, MD   Assessment & Plan: Visit Diagnoses:  1. Pain in left leg   2. Muscle spasm of left lower extremity     Plan: Impression is chronic left leg pain and left side spasms.  I believe her pain could actually be coming from her left hip.  I will have her follow-up with Dr. Junius Roads for diagnostic ultrasound-guided cortisone injection.  For the spasms, we will refer her to neurology.  Call with concerns or questions in the meantime.  Follow-Up Instructions: Return if symptoms worsen or fail to improve.   Orders:  Orders Placed This Encounter  Procedures  . XR HIP UNILAT W OR W/O PELVIS 2-3 VIEWS LEFT  . Ambulatory referral to Neurology   No orders of the defined types were placed in this encounter.     Procedures: No procedures performed   Clinical Data: No additional findings.   Subjective: Chief Complaint  Patient presents with  . Left Knee - Follow-up  . Right Knee - Follow-up    HPI patient is a pleasant 50 year old female who presents to our clinic today with continued left knee pain and left body spasms.  She notes that she was seen by Dr. Junius Roads for both knees were injected with cortisone about a month ago.  She denies any relief following the injections.  If anything, she feels that she has more pressure in her knees.  She is also endorsing pain to the left groin.  No pain to the buttocks or distal to the knee.  A previous cortisone injection to the hip joint.  Review of Systems as detailed in HPI.  All others reviewed and negative.   Objective: Vital Signs: There were no vitals taken for this visit.  Physical Exam well-developed and well-nourished female in no acute distress.  Alert and oriented x3.  Ortho Exam left hip exam  she has a positive logroll.  Negative FADIR.  Knee exam shows range of motion of 0 to 90 degrees.  She does have medial joint line tenderness.  She is neurovascular intact distally.  Specialty Comments:  No specialty comments available.  Imaging: XR HIP UNILAT W OR W/O PELVIS 2-3 VIEWS LEFT  Result Date: 02/16/2020 Mild to moderated degenerative changes    PMFS History: Patient Active Problem List   Diagnosis Date Noted  . Hypoxemia   . Surgery, elective   . S/P PICC central line placement   . Degenerative spondylolisthesis 11/14/2018  . S/P lumbar fusion 11/14/2018  . 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 GERD (gastroesophageal reflux disease) 07/24/2016  . Ganglion cyst of right foot 04/12/2015  . Graves disease 10/29/2013  . Benign essential HTN 10/29/2013  . Lumbar disc disease 10/29/2013  . Morbid obesity with BMI of 45.0-49.9, adult ([employer]) 10/29/2013   Past Medical History:  Diagnosis Date  . Arthritis   . Depression   . Disc disease with myelopathy, lumbar   . Edema    bilateral lower extremities  . GERD (gastroesophageal reflux disease)   . Graves disease   . Hypertension   . Muscle spasms of both lower extremities   .  Sleep apnea   . Stomach ulcer   . Thyroid cancer ([facility])     Family History  Problem Relation Age of Onset  . Diabetes Mother   . Hypertension Mother   . Diabetes Father   . Hypertension Father   . Heart failure Father     Past Surgical History:  Procedure Laterality Date  . CARPAL TUNNEL RELEASE Right   . COLONOSCOPY     repair of fissures  . glands     under arms-removed  . TONSILLECTOMY     Social History   Occupational History  . Not on file  Tobacco Use  . Smoking status: Current Every Day Smoker    Packs/day: 0.50    Years: 35.00    Pack years: 17.50    Types: Cigarettes  . Smokeless tobacco: Never Used  Vaping Use  . Vaping Use: Never used  Substance and Sexual Activity  . Alcohol use: Yes  . Drug use: Yes     Types: Marijuana    Comment: 2x/daily  . Sexual activity: Yes    Birth control/protection: Condom

## 2020-02-26 ENCOUNTER — Ambulatory Visit: Payer: Medicare Other | Admitting: Family Medicine

## 2020-03-09 ENCOUNTER — Telehealth: Payer: Self-pay

## 2020-03-09 NOTE — Telephone Encounter (Signed)
Submitted VOB, Durolane, bilateral knee. 

## 2020-03-17 ENCOUNTER — Telehealth: Payer: Self-pay

## 2020-03-17 NOTE — Telephone Encounter (Signed)
Tried calling patient concerning gel injection, but no answer and left VM on cell number listed in chart to call back to schedule with Dr. Junius Roads.  Approved for Durolane, bilateral knee. [place] Patient will be responsible for 20% OOP. No Co-pay No PA required

## 2020-04-04 ENCOUNTER — Encounter: Payer: Self-pay | Admitting: Family Medicine

## 2020-04-04 ENCOUNTER — Ambulatory Visit: Payer: Self-pay

## 2020-04-04 ENCOUNTER — Ambulatory Visit (INDEPENDENT_AMBULATORY_CARE_PROVIDER_SITE_OTHER): Payer: Medicare Other | Admitting: Family Medicine

## 2020-04-04 ENCOUNTER — Other Ambulatory Visit: Payer: Self-pay

## 2020-04-04 ENCOUNTER — Ambulatory Visit: Payer: Medicare Other | Admitting: Family Medicine

## 2020-04-04 DIAGNOSIS — M1712 Unilateral primary osteoarthritis, left knee: Secondary | ICD-10-CM | POA: Diagnosis not present

## 2020-04-04 DIAGNOSIS — M1711 Unilateral primary osteoarthritis, right knee: Secondary | ICD-10-CM

## 2020-04-04 DIAGNOSIS — M170 Bilateral primary osteoarthritis of knee: Secondary | ICD-10-CM

## 2020-04-04 NOTE — Progress Notes (Signed)
   Office Visit Note   Patient: Tonya Greene           Date of Birth: 04-May-1970           MRN: [id number] Visit Date: 04/04/2020 Requested by: Lin Landsman, [address] [place],  [place] [id number] PCP: Lin Landsman, MD  Subjective: Chief Complaint  Patient presents with  . Right Knee - Pain, Follow-up    Bilateral Durolane injections, planned, under ultrasound guidance  . Left Knee - Pain, Follow-up    HPI: She is here with bilateral knee pain, for planned ultrasound-guided Durolane injections.                ROS:   All other systems were reviewed and are negative.  Objective: Vital Signs: There were no vitals taken for this visit.  Physical Exam:  General:  Alert and oriented, in no acute distress. Pulm:  Breathing unlabored. Psy:  Normal mood, congruent affect. Skin: No erythema or warmth Knees: No effusions detected.  Imaging: US Guided Needle Placement  Result Date: 04/04/2020 Bilateral knee ultrasound-guided injections: After sterile prep with Betadine, injected 3 cc 0.25% bupivacaine and Durolane into the superior lateral joint recess of each knee.   Assessment & Plan: 1.  Bilateral knee osteoarthritis -Durolane injections given today as above.  Follow-up as needed.     Procedures: No procedures performed        PMFS History: Patient Active Problem List   Diagnosis Date Noted  . Hypoxemia   . Surgery, elective   . S/P PICC central line placement   . Degenerative spondylolisthesis 11/14/2018  . S/P lumbar fusion 11/14/2018  . 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 GERD (gastroesophageal reflux disease) 07/24/2016  . Ganglion cyst of right foot 04/12/2015  . Graves disease 10/29/2013  . Benign essential HTN 10/29/2013  . Lumbar disc disease 10/29/2013  . Morbid obesity with BMI of 45.0-49.9, adult ([facility]) 10/29/2013   Past Medical History:  Diagnosis Date  . Arthritis   . Depression   . Disc disease with myelopathy, lumbar   .  Edema    bilateral lower extremities  . GERD (gastroesophageal reflux disease)   . Graves disease   . Hypertension   . Muscle spasms of both lower extremities   . Sleep apnea   . Stomach ulcer   . Thyroid cancer ([facility])     Family History  Problem Relation Age of Onset  . Diabetes Mother   . Hypertension Mother   . Diabetes Father   . Hypertension Father   . Heart failure Father     Past Surgical History:  Procedure Laterality Date  . CARPAL TUNNEL RELEASE Right   . COLONOSCOPY     repair of fissures  . glands     under arms-removed  . TONSILLECTOMY     Social History   Occupational History  . Not on file  Tobacco Use  . Smoking status: Current Every Day Smoker    Packs/day: 0.50    Years: 35.00    Pack years: 17.50    Types: Cigarettes  . Smokeless tobacco: Never Used  Vaping Use  . Vaping Use: Never used  Substance and Sexual Activity  . Alcohol use: Yes  . Drug use: Yes    Types: Marijuana    Comment: 2x/daily  . Sexual activity: Yes    Birth control/protection: Condom

## 2020-04-05 IMAGING — MR MR LUMBAR SPINE W/O CM
3 series · 24 of 48 positions shown · non-contrast
Comparison: CT Abdomen and Pelvis 09/06/2018.

CLINICAL DATA: 48-year-old female with low back pain for several
months greater on the left. No known injury.

History of thyroid cancer.
EXAM:
MRI LUMBAR SPINE WITHOUT CONTRAST
TECHNIQUE: Multiplanar, multisequence MR imaging of the lumbar spine was
performed. No intravenous contrast was administered.

[Series 4: T1 · sagittal · 4.0mm · 0.55mm/px · 8 of 16 slices shown (1 of 2)]
[im 1/16]
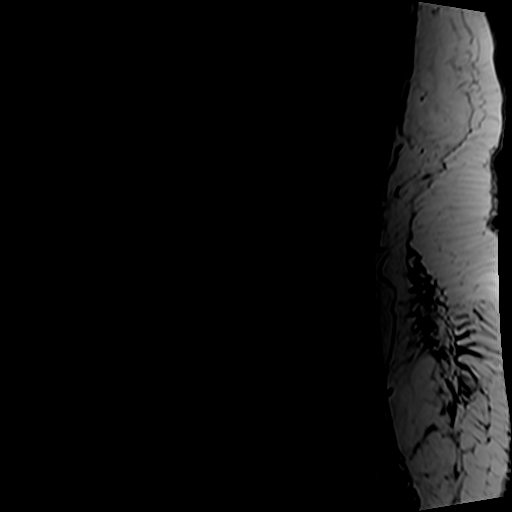
[im 3/16]
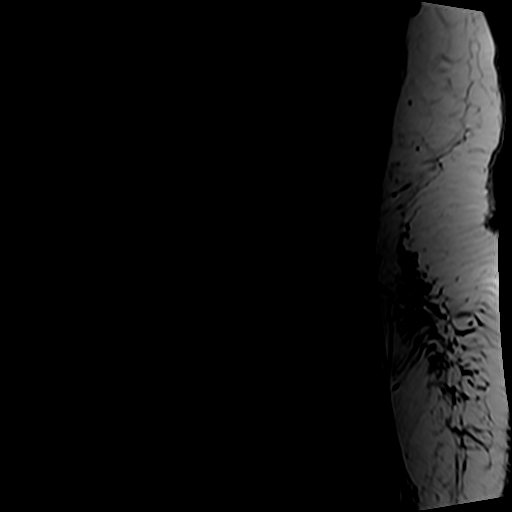
[im 5/16]
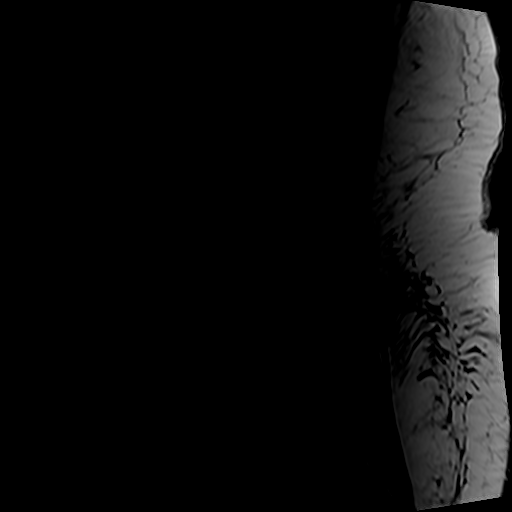
[im 7/16]
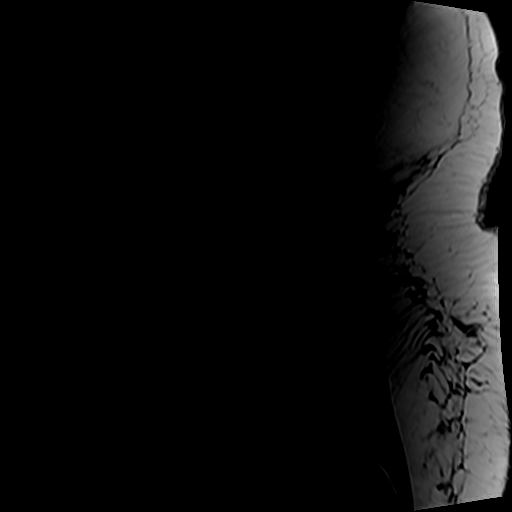
[im 9/16]
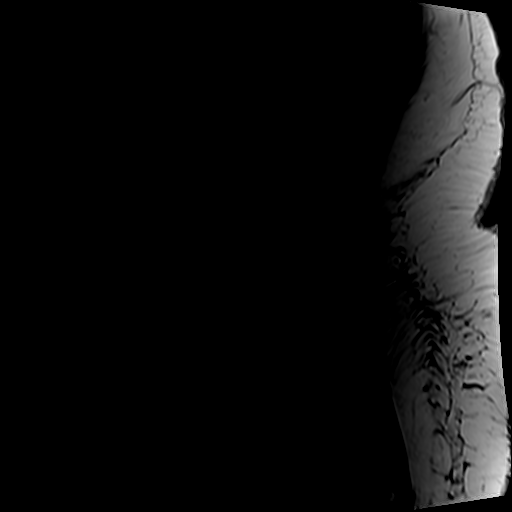
[im 11/16]
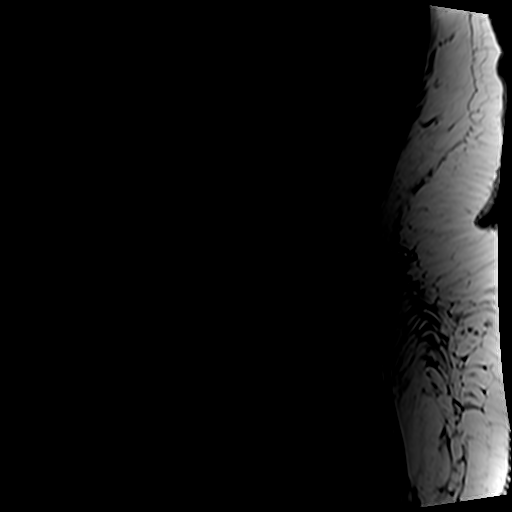
[im 13/16]
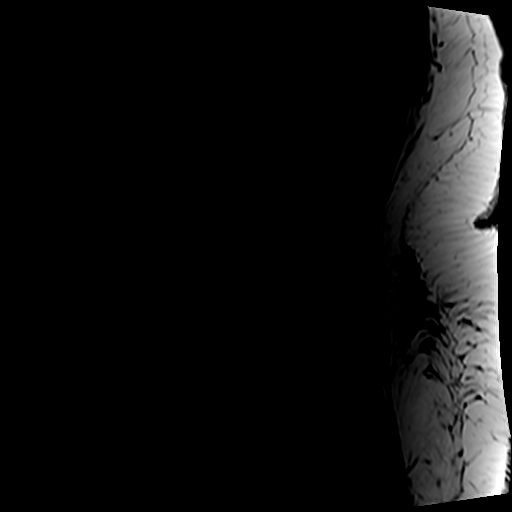
[im 16/16]
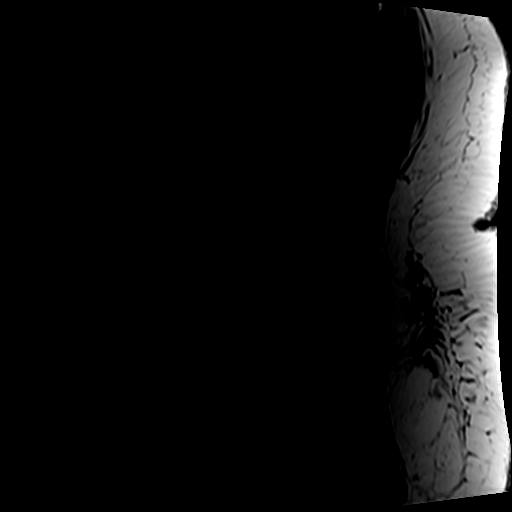

[Series 5: T2 · axial · 4.0mm · 0.78mm/px · z∈[-124,+94]mm · 9 of 36 slices shown]
[im 2/36]
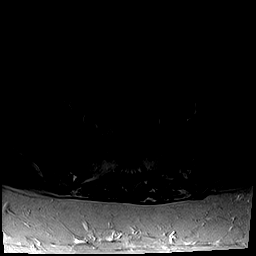
[im 6/36]
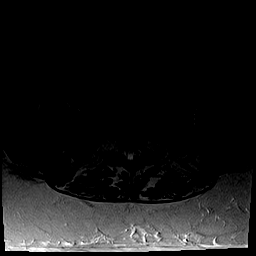
[im 12/36]
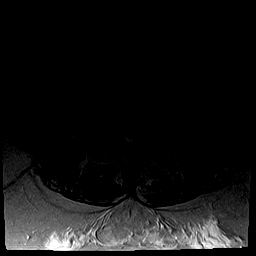
[im 15/36]
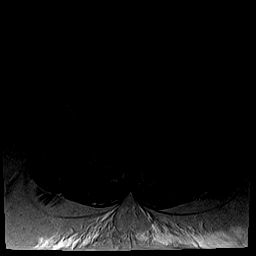
[im 19/36]
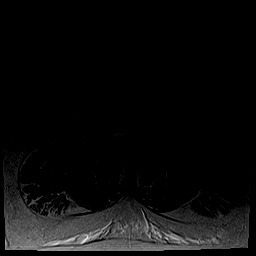
[im 21/36]
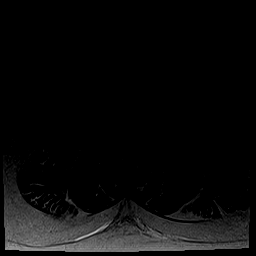
[im 24/36]
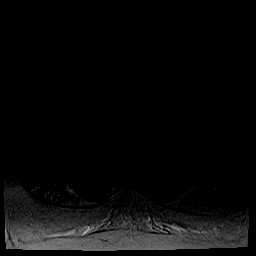
[im 30/36]
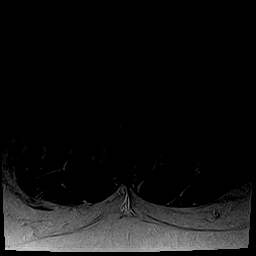
[im 34/36]
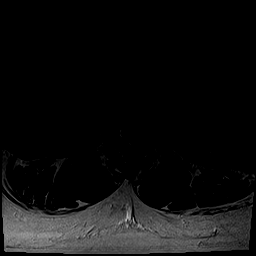

[Series 6: T1 · axial · 4.0mm · 0.39mm/px · z∈[-124,+49]mm · 7 of 36 slices shown (2 of 2)]
[im 2/36]
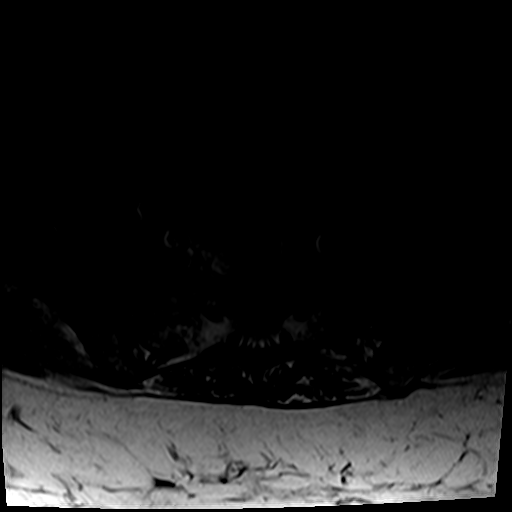
[im 6/36]
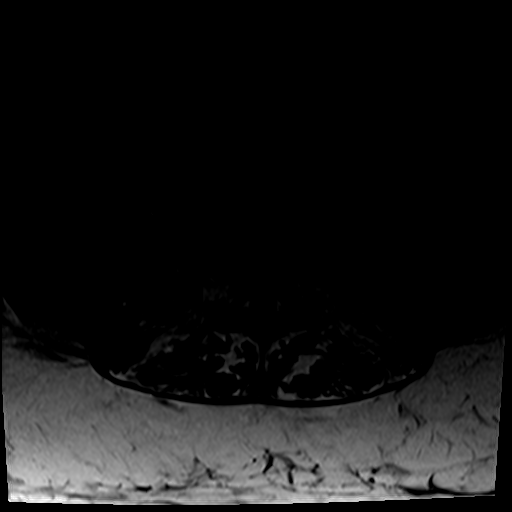
[im 12/36]
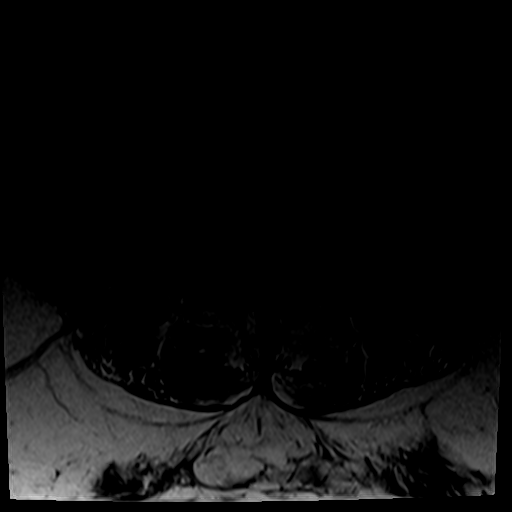
[im 15/36]
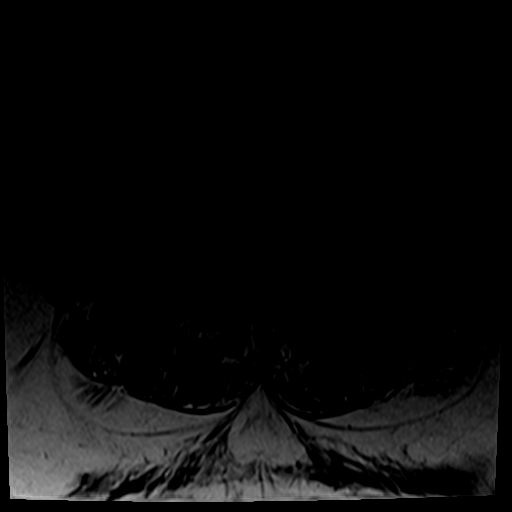
[im 19/36]
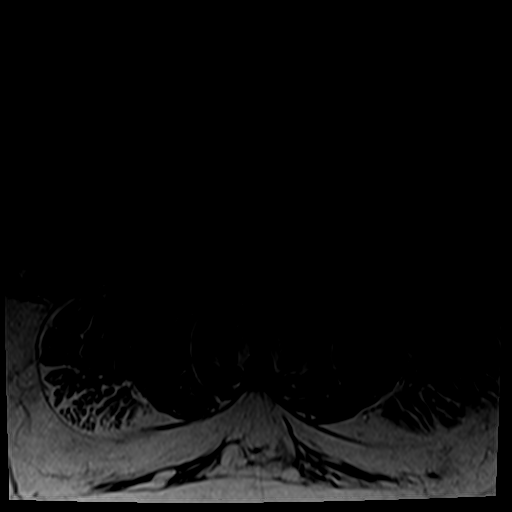
[im 21/36]
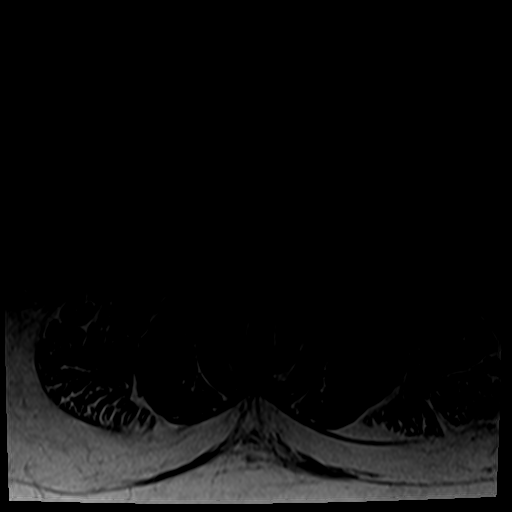
[im 30/36]
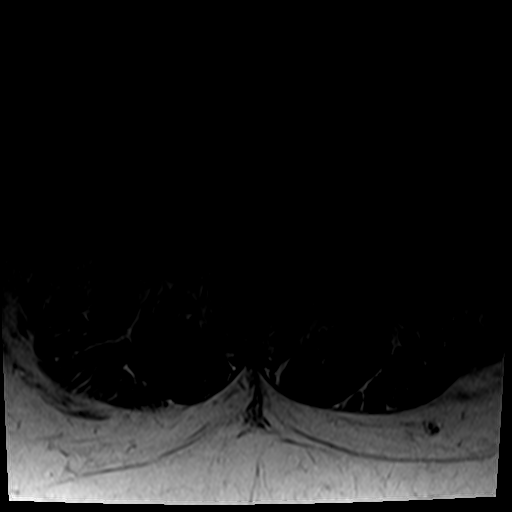

[24 of 48 positions shown; findings below may reference images not displayed]

FINDINGS: Segmentation:  Normal on the comparison CT.

Alignment: Stable from last month with preserved lumbar lordosis and
grade 1 anterolisthesis of L4 on L5. Subtle retrolisthesis of L5 on
S1.

Vertebrae: Abnormal disc space loss and disc space signal at L4-L5,
but no associated endplate marrow edema or paraspinal soft tissue
inflammation at that level. Underlying mild degenerative chronic
marrow signal changes in the lumbar spine. Intact visible sacrum and
SI joints. No acute osseous abnormality identified.

Conus medullaris and cauda equina: Conus extends to the L1 level. No
lower spinal cord or conus signal abnormality.

Paraspinal and other soft tissues: Large body habitus. Negative
visible abdominal and pelvic viscera. Negative visualized posterior
paraspinal soft tissues.

Disc levels:

No lower thoracic spinal stenosis.

T12-L1: Mild disc bulge and posterior element hypertrophy. Mild T12
foraminal stenosis.

L1-L2:  Mild to moderate facet hypertrophy.  No stenosis.

L2-L3:  Moderate posterior element hypertrophy.  No stenosis.

L3-L4: Mild disc space loss and circumferential disc bulge. Moderate
to severe facet hypertrophy. Right side facet joint effusion. Mild
to moderate spinal stenosis. No significant foraminal stenosis.

L4-L5: Loss grade 1 anterolisthesis with. Vacuum severe disc space
disc here last month, increased T2 and STIR signal in the disc space
today. Bulky circumferential disc osteophyte complex with
broad-based posterior disc extrusion or pseudo disc. Severe facet
hypertrophy.

Severe spinal and bilateral lateral recess stenosis (descending L5
nerve levels). Moderate to severe bilateral L4 foraminal stenosis.

L5-S1: Disc desiccation with mild circumferential disc bulge.
Moderate posterior element hypertrophy. No stenosis.
IMPRESSION: 1. Grade 1 anterolisthesis at L4-L5 with chronic severe disc and
posterior element degeneration.
Subsequent severe spinal, bilateral lateral recess, and up to severe
bilateral foraminal stenosis.
Abnormal signal in the disc space there is felt to be degenerative
in nature, with no associated features of spinal infection at this
time.
2. Up to moderate multifactorial spinal stenosis at L3-L4, also in
part due to moderate to severe facet hypertrophy.

## 2020-04-06 ENCOUNTER — Ambulatory Visit: Payer: Medicare Other | Admitting: Family Medicine

## 2020-06-04 ENCOUNTER — Other Ambulatory Visit: Payer: Self-pay | Admitting: Endocrinology

## 2020-09-30 ENCOUNTER — Other Ambulatory Visit (INDEPENDENT_AMBULATORY_CARE_PROVIDER_SITE_OTHER): Payer: Medicare Other

## 2020-09-30 ENCOUNTER — Other Ambulatory Visit: Payer: Self-pay

## 2020-09-30 DIAGNOSIS — E890 Postprocedural hypothyroidism: Secondary | ICD-10-CM

## 2020-09-30 LAB — T4, FREE: Free T4: 0.97 ng/dL (ref 0.60–1.60)

## 2020-09-30 LAB — TSH: TSH: 4.18 u[IU]/mL (ref 0.35–5.50)

## 2020-10-03 ENCOUNTER — Other Ambulatory Visit: Payer: Self-pay | Admitting: Endocrinology

## 2020-10-04 ENCOUNTER — Ambulatory Visit: Payer: Medicare Other | Admitting: Endocrinology

## 2020-10-26 ENCOUNTER — Emergency Department (HOSPITAL_COMMUNITY): Payer: Medicare Other

## 2020-10-26 ENCOUNTER — Encounter (HOSPITAL_COMMUNITY): Payer: Self-pay

## 2020-10-26 ENCOUNTER — Emergency Department (HOSPITAL_COMMUNITY)
Admission: EM | Admit: 2020-10-26 | Discharge: 2020-10-27 | Disposition: A | Discharge status: Home / Self Care | Payer: Medicare Other | Attending: Emergency Medicine | Admitting: Emergency Medicine

## 2020-10-26 DIAGNOSIS — I10 Essential (primary) hypertension: Secondary | ICD-10-CM | POA: Insufficient documentation

## 2020-10-26 DIAGNOSIS — M25552 Pain in left hip: Secondary | ICD-10-CM | POA: Insufficient documentation

## 2020-10-26 DIAGNOSIS — W19XXXA Unspecified fall, initial encounter: Secondary | ICD-10-CM

## 2020-10-26 DIAGNOSIS — Z79899 Other long term (current) drug therapy: Secondary | ICD-10-CM | POA: Insufficient documentation

## 2020-10-26 DIAGNOSIS — N9489 Other specified conditions associated with female genital organs and menstrual cycle: Secondary | ICD-10-CM | POA: Insufficient documentation

## 2020-10-26 DIAGNOSIS — M25562 Pain in left knee: Secondary | ICD-10-CM | POA: Diagnosis present

## 2020-10-26 DIAGNOSIS — M25561 Pain in right knee: Secondary | ICD-10-CM | POA: Insufficient documentation

## 2020-10-26 DIAGNOSIS — M1712 Unilateral primary osteoarthritis, left knee: Secondary | ICD-10-CM | POA: Diagnosis not present

## 2020-10-26 DIAGNOSIS — E039 Hypothyroidism, unspecified: Secondary | ICD-10-CM | POA: Insufficient documentation

## 2020-10-26 DIAGNOSIS — Z85850 Personal history of malignant neoplasm of thyroid: Secondary | ICD-10-CM | POA: Diagnosis not present

## 2020-10-26 DIAGNOSIS — M25551 Pain in right hip: Secondary | ICD-10-CM | POA: Diagnosis not present

## 2020-10-26 DIAGNOSIS — F17210 Nicotine dependence, cigarettes, uncomplicated: Secondary | ICD-10-CM | POA: Insufficient documentation

## 2020-10-26 DIAGNOSIS — W06XXXA Fall from bed, initial encounter: Secondary | ICD-10-CM | POA: Insufficient documentation

## 2020-10-26 LAB — CBC WITH DIFFERENTIAL/PLATELET
Abs Immature Granulocytes: 0.02 10*3/uL (ref 0.00–0.07)
Basophils Absolute: 0 10*3/uL (ref 0.0–0.1)
Basophils Relative: 0 %
Eosinophils Absolute: 0 10*3/uL (ref 0.0–0.5)
Eosinophils Relative: 1 %
HCT: 42.9 % (ref 36.0–46.0)
Hemoglobin: 14 g/dL (ref 12.0–15.0)
Immature Granulocytes: 0 %
Lymphocytes Relative: 30 %
Lymphs Abs: 1.7 10*3/uL (ref 0.7–4.0)
MCH: 31.5 pg (ref 26.0–34.0)
MCHC: 32.6 g/dL (ref 30.0–36.0)
MCV: 96.4 fL (ref 80.0–100.0)
Monocytes Absolute: 0.4 10*3/uL (ref 0.1–1.0)
Monocytes Relative: 8 %
Neutro Abs: 3.4 10*3/uL (ref 1.7–7.7)
Neutrophils Relative %: 61 %
Platelets: 417 10*3/uL — ABNORMAL HIGH (ref 150–400)
RBC: 4.45 MIL/uL (ref 3.87–5.11)
RDW: 13.5 % (ref 11.5–15.5)
WBC: 5.6 10*3/uL (ref 4.0–10.5)
nRBC: 0 % (ref 0.0–0.2)

## 2020-10-26 LAB — COMPREHENSIVE METABOLIC PANEL
ALT: 26 U/L (ref 0–44)
AST: 30 U/L (ref 15–41)
Albumin: 4.7 g/dL (ref 3.5–5.0)
Alkaline Phosphatase: 86 U/L (ref 38–126)
Anion gap: 8 (ref 5–15)
BUN: 13 mg/dL (ref 6–20)
CO2: 26 mmol/L (ref 22–32)
Calcium: 9.9 mg/dL (ref 8.9–10.3)
Chloride: 104 mmol/L (ref 98–111)
Creatinine, Ser: 0.65 mg/dL (ref 0.44–1.00)
GFR, Estimated: >60 mL/min (ref >60)
Glucose, Bld: 108 mg/dL — ABNORMAL HIGH (ref 70–99)
Potassium: 4.6 mmol/L (ref 3.5–5.1)
Sodium: 138 mmol/L (ref 135–145)
Total Bilirubin: 0.5 mg/dL (ref 0.3–1.2)
Total Protein: 8.3 g/dL — ABNORMAL HIGH (ref 6.5–8.1)

## 2020-10-26 LAB — CK: Total CK: 268 U/L — ABNORMAL HIGH (ref 38–234)

## 2020-10-26 LAB — HCG, SERUM, QUALITATIVE: Preg, Serum: NEGATIVE

## 2020-10-26 MED ORDER — OXYCODONE-ACETAMINOPHEN 5-325 MG PO TABS
2.0000 | ORAL_TABLET | Freq: Once | ORAL | Status: AC
  Administered 2020-10-26: 2 via ORAL
  Filled 2020-10-26: qty 2

## 2020-10-26 NOTE — ED Provider Notes (Signed)
[facility] DEPT Provider Note   CSN: [id number] Arrival date & time: 10/26/20  1310     History Chief Complaint  Patient presents with   Lytle Michaels    SHERLIE BOYUM is a 50 y.o. female PMH Graves' disease, HTN, thyroid cancer, previous lumbar fusion who presents the emergency department for evaluation of a fall and left knee pain.  Patient states that she fell 3 hours prior to arrival and has been on the ground for 3 hours.  She is complaining of significant left knee pain, left hip pain and mild right knee and hip pain.  She has known osteoarthritis of these knees and is supposed to receive a hip and knee replacement sometime in the future.  She denies chest pain, shortness of breath, abdominal pain, nausea, vomiting or other systemic or traumatic complaints.  She states that she has no neuropathy in both of her lower extremities but does not have any new saddle anesthesia, urinary incontinence, fecal incontinence, new weakness or other red flag signs of back pain.   Fall Pertinent negatives include no chest pain, no abdominal pain and no shortness of breath.      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     Family History  Problem Relation Age of Onset   Diabetes Mother    Hypertension Mother    Diabetes Father    Hypertension Father    Heart failure Father     Social History   Tobacco Use   Smoking status: Every Day    Packs/day: 0.50    Years: 35.00    Pack years: 17.50    Types: Cigarettes   Smokeless tobacco: Never  Vaping Use   Vaping Use: Never used  Substance Use Topics   Alcohol use: Yes   Drug use: Yes    Types: Marijuana    Comment: 2x/daily    Home Medications Prior to Admission medications   Medication Sig Start Date End Date Taking? Authorizing Provider  albuterol (PROVENTIL HFA;VENTOLIN HFA) 108 (90 Base) MCG/ACT inhaler Inhale 2 puffs into the lungs every 6 (six) hours as needed for wheezing or shortness of breath. 08/16/15   Hoyt Koch, MD  cetirizine (ZYRTEC) 10 MG tablet Take 1 tablet (10 mg total) by mouth daily.  Need annual visit for further refills Patient taking differently: Take 10 mg by mouth daily. 06/19/17   Hoyt Koch, MD  diazepam (VALIUM) 5 MG tablet Take 1-2 tablets (5-10 mg total) by mouth every 6 (six) hours as needed for muscle spasms. 11/21/18   Earnie Larsson, MD  famotidine (PEPCID) 20 MG tablet Take 20 mg by mouth 2 (two) times daily. Patient not taking: Reported on 01/21/2020    [provider]  furosemide (LASIX) 40 MG tablet Take 1 tablet (40 mg total) by mouth 2 (two) times daily. 11/19/18 11/19/19  Georgette Shell, MD  levothyroxine (SYNTHROID) 150 MCG tablet TAKE 1 TABLET (150 MCG TOTAL) BY MOUTH DAILY BEFORE BREAKFAST. 10/03/20   [name], MD  lisinopril (ZESTRIL) 10 MG tablet Take 1 tablet (10 mg total) by mouth daily. 11/19/18   Georgette Shell, MD  NIFEdipine (PROCARDIA-XL/ADALAT CC) 60 MG 24 hr tablet TAKE 1 TABLET BY MOUTH DAILY Patient taking differently: Take 60 mg by mouth daily. 03/28/17   Hoyt Koch, MD  oxyCODONE 10 MG TABS Take 1 tablet (10 mg total) by mouth every 3 (three) hours as needed for severe pain ((score 7 to 10)). 11/21/18   Earnie Larsson, MD  potassium chloride (KLOR-CON) 10 MEQ tablet Take 2 tablets (20 mEq total) by mouth 2 (two) times daily. 11/19/18   Georgette Shell, MD  promethazine (PHENERGAN) 6.25 MG/5ML syrup Take 10 mLs by mouth 3 (three) times daily. 10/07/18   [provider]    Allergies    Tape  Review of Systems   Review of Systems  Constitutional:  Negative for chills and fever.  HENT:  Negative for ear pain and sore throat.   Eyes:  Negative for pain and visual disturbance.  Respiratory:  Negative for cough and shortness of breath.   Cardiovascular:  Negative for chest pain and palpitations.  Gastrointestinal:  Negative for abdominal pain and vomiting.  Genitourinary:  Negative for dysuria and hematuria.  Musculoskeletal:  Positive for arthralgias and myalgias. Negative for back pain.  Skin:  Negative for color change and rash.  Neurological:  Negative for seizures and syncope.  All other systems reviewed and are negative.  Physical Exam Updated Vital Signs BP (!) 141/76   Pulse 98   Temp 98.3 F (36.8 C) (Oral)   Resp 18   SpO2 99%   Physical Exam Vitals and nursing note reviewed.  Constitutional:      General: She is not in acute distress.    Appearance: She is well-developed.  HENT:     Head: Normocephalic and atraumatic.  Eyes:     Conjunctiva/sclera: Conjunctivae normal.  Cardiovascular:     Rate and Rhythm: Normal rate and regular rhythm.     Heart sounds: No murmur heard. Pulmonary:     Effort: Pulmonary effort is normal. No respiratory distress.     Breath sounds: Normal breath sounds.  Abdominal:     Palpations: Abdomen is soft.      Tenderness: There is no abdominal tenderness.  Musculoskeletal:        General: Tenderness (Left knee greater than right) present.     Cervical back: Neck supple.  Skin:    General: Skin is warm and dry.  Neurological:     Mental Status: She is alert.    ED Results / Procedures / Treatments   Labs (all labs ordered are listed, but only abnormal results are displayed) Labs Reviewed  COMPREHENSIVE METABOLIC PANEL - Abnormal;  Notable for the following components:      Result Value   Glucose, Bld 108 (*)    Total Protein 8.3 (*)    All other components within normal limits  CBC WITH DIFFERENTIAL/PLATELET - Abnormal; Notable for the following components:   Platelets 417 (*)    All other components within normal limits  CK - Abnormal; Notable for the following components:   Total CK 268 (*)    All other components within normal limits  HCG, SERUM, QUALITATIVE    EKG None  Radiology DG Lumbar Spine Complete  Result Date: 10/26/2020 CLINICAL DATA:  Post fall, now with lower extremity paresthesias. EXAM: LUMBAR SPINE - COMPLETE 4+ VIEW COMPARISON:  None. FINDINGS: Examination is degraded due to patient body habitus and large field of view. There are 5 non rib-bearing lumbar type vertebral bodies. Mild scoliotic curvature of the thoracolumbar spine with caudal component convex the right measuring approximately 16 degrees (as measured from the superior endplate L1 to the superior endplate of L4). No anterolisthesis or retrolisthesis. Post L3-L5 paraspinal fusion and intervertebral disc space replacement without evidence of hardware failure or loosening. Lumbar vertebral body heights are preserved. Remaining lumbar intervertebral disc space heights are preserved. Limited visualization of the bilateral SI joints and hips is normal. Suspected gluteal calcifications. Regional soft tissues appear otherwise normal. No radiopaque foreign body. IMPRESSION: 1. No definite acute findings on this  body habitus degraded examination. 2. Post L3-L5 paraspinal fusion and intervertebral disc space replacement without evidence of hardware failure or loosening. Electronically Signed   By: Sandi Mariscal M.D.   On: 10/26/2020 16:37   CT Hip Left Wo Contrast  Result Date: 10/26/2020 CLINICAL DATA:  Status post multiple falls. EXAM: CT OF THE LEFT HIP WITHOUT CONTRAST TECHNIQUE: Multidetector CT imaging of the left hip was performed according to the standard protocol. Multiplanar CT image reconstructions were also generated. COMPARISON:  None. FINDINGS: Bones/Joint/Cartilage There is no evidence of acute fracture or dislocation. No lytic or blastic lesions are identified. No degenerative changes seen involving the left hip. Degenerative changes are noted within the visualized portions of the bilateral sacroiliac joints. Ligaments Suboptimally assessed by CT. Muscles and Tendons Unremarkable. Soft tissues Unremarkable. IMPRESSION: 1. No evidence of acute fracture or dislocation. 2. Degenerative changes involving the bilateral sacroiliac joints. Electronically Signed   By: Virgina Norfolk M.D.   On: 10/26/2020 19:20   DG Knee Complete 4 Views Left  Result Date: 10/26/2020 CLINICAL DATA:  Post fall, now with left knee pain. EXAM: LEFT KNEE - COMPLETE 4+ VIEW COMPARISON:  Left knee radiographs-03/17/2019 FINDINGS: Examination is degraded due to patient body habitus and large field of view. No definite fracture or dislocation. Severe tricompartmental degenerative change of the left knee, worse within the lateral compartment with complete joint space loss, articular surface irregularity, subchondral sclerosis and osteophytosis. Suspected small knee joint effusion without definitive evidence of a lipohemarthrosis. Scattered adjacent dermal calcifications. No radiopaque foreign body. IMPRESSION: 1. No definite acute findings on this body habitus degraded examination. 2. Severe tricompartmental degenerative change of the  knee, worse within the lateral compartment, progressed compared to the 03/2019 examination. Electronically Signed   By: Sandi Mariscal M.D.   On: 10/26/2020 16:40   DG Knee Complete 4 Views Right  Result Date: 10/26/2020 CLINICAL DATA:  Post fall, now with right knee pain. EXAM: RIGHT KNEE - COMPLETE 4+ VIEW COMPARISON:  None. FINDINGS: No fracture or dislocation. Very mild tricompartmental degenerative change of the knee with joint  space loss and subchondral sclerosis. There is minimal spurring the tibial spines. No evidence of chondrocalcinosis. No definite knee joint effusion. Scattered dermal calcifications. No radiopaque foreign body. IMPRESSION: 1. No acute findings. 2. Mild tricompartmental degenerative change of the knee. Electronically Signed   By: Sandi Mariscal M.D.   On: 10/26/2020 16:41    Procedures Procedures   Medications Ordered in ED Medications  oxyCODONE-acetaminophen (PERCOCET/ROXICET) 5-325 MG per tablet 2 tablet (2 tablets Oral Given 10/26/20 1812)    ED Course  I have reviewed the triage vital signs and the nursing notes.  Pertinent labs & imaging results that were available during my care of the patient were reviewed by me and considered in my medical decision making (see chart for details).    MDM Rules/Calculators/A&P                           Seen emergency department for evaluation of knee pain and hip pain after fall.  Physical exam reveals significant tenderness over the left knee greater than right, mild tenderness over the left hip.  Strength in lower extremities at patient's baseline, 4 out of 5 bilaterally.  No sensory deficits.  X-ray images of the lumbar spine with no acute fracture, right knee unremarkable, left knee with severely worsening tricompartmental osteoarthritis.  A CT of the left hip was obtained to rule out occult fracture which was reassuringly negative.  I spoke with Dr. Lorin Mercy of orthopedic surgery who states that he will see the patient in clinic  on Friday to discuss possible replacement of the knee/hip.  Patient then discharged Final Clinical Impression(s) / ED Diagnoses Final diagnoses:  Fall, initial encounter  Primary osteoarthritis of left knee    Rx / DC Orders ED Discharge Orders     None        Indy Kuck, Debe Coder, MD 10/26/20 [phone]

## 2020-10-26 NOTE — ED Triage Notes (Signed)
Pt via EMS from home with c/o fall from her bed today. Pt reports she has been on the floor since she fell and doesn't have any feeling in her legs.

## 2020-10-26 NOTE — ED Provider Notes (Signed)
Emergency Medicine Provider Triage Evaluation Note  Tonya Greene , a 50 y.o. female  was evaluated in triage.  Pt complains of a fall that occurred prior to arrival.  Patient states that she was transferring from her wheelchair to a bedside commode and fell to the floor.  She states it was a carpeted surface.  No head trauma or LOC.  Denies being anticoagulated.  Reports pain to the low back as well as bilateral knees.  Physical Exam  BP (!) 136/109   Pulse 91   Temp 98.3 F (36.8 C) (Oral)   Resp 16   SpO2 100%  Gen:   Awake, no distress   Resp:  Normal effort  MSK:   Moves extremities without difficulty  Other:    Medical Decision Making  Medically screening exam initiated at 1:38 PM.  Appropriate orders placed.  Tonya Greene was informed that the remainder of the evaluation will be completed by another provider, this initial triage assessment does not replace that evaluation, and the importance of remaining in the ED until their evaluation is complete.   Tonya Sexton, PA-C 12/08/13 [id number]    Campbell Stall P, DO 59/29/24 [phone]

## 2020-10-26 NOTE — ED Notes (Signed)
Pt reports that she is unable to ambulate into her home and will require EMS transport back.  Call made to [facility]

## 2020-10-26 NOTE — ED Notes (Signed)
Pt has 6 patients ahead of her for transport back home

## 2020-10-26 NOTE — Discharge Instructions (Addendum)
SwollenYou were seen in the emergency department for evaluation of worsening left knee pain after a fall.  Your x-rays show worsening osteoarthritis and I am concerned that we may need to move up the timetable on replacement.  This call needs to be made by an orthopedic surgeon and I spoke to Dr. Inda Merlin today who is willing to see you in clinic on Friday.  At this time you are safe for discharge, and please follow-up with Dr. Rodell Perna in 2 days.  In the emergency department if you have new or worsening numbness, weakness, difficulty urinating, involuntary urinating or defecation, inability to feel the groin.

## 2020-10-28 ENCOUNTER — Ambulatory Visit: Payer: Medicare Other | Admitting: Orthopaedic Surgery

## 2020-11-01 ENCOUNTER — Ambulatory Visit (INDEPENDENT_AMBULATORY_CARE_PROVIDER_SITE_OTHER): Payer: Medicare Other | Admitting: Orthopaedic Surgery

## 2020-11-01 ENCOUNTER — Other Ambulatory Visit: Payer: Self-pay

## 2020-11-01 ENCOUNTER — Encounter: Payer: Self-pay | Admitting: Orthopaedic Surgery

## 2020-11-01 VITALS — Ht 64.0 in | Wt 274.2 lb

## 2020-11-01 DIAGNOSIS — M62838 Other muscle spasm: Secondary | ICD-10-CM

## 2020-11-01 DIAGNOSIS — M25552 Pain in left hip: Secondary | ICD-10-CM

## 2020-11-01 DIAGNOSIS — M25551 Pain in right hip: Secondary | ICD-10-CM

## 2020-11-01 DIAGNOSIS — M170 Bilateral primary osteoarthritis of knee: Secondary | ICD-10-CM | POA: Diagnosis not present

## 2020-11-01 DIAGNOSIS — Z6841 Body Mass Index (BMI) 40.0 and over, adult: Secondary | ICD-10-CM | POA: Diagnosis not present

## 2020-11-01 NOTE — Progress Notes (Signed)
Office Visit Note   Patient: Tonya Greene           Date of Birth: Sep 13, 1970           MRN: [id number] Visit Date: 11/01/2020              Requested by: Lin Landsman, [address] [place],  [place] [id number] PCP: Lin Landsman, MD   Assessment & Plan: Visit Diagnoses:  1. Bilateral primary osteoarthritis of knee   2. Hip pain, bilateral   3. Muscle spasms of both lower extremities   4. Body mass index 45.0-49.9, adult ([place])   5. Morbid obesity ([place])     Plan: Impression is bilateral hip and bilateral knee osteoarthritis with overall total body deconditioning.  In regards to her hips, we have discussed referral to Dr. Ernestina Patches for intra-articular cortisone injection.  In regards to her knees, cortisone and viscosupplementation's do not alleviate her symptoms.  We have discussed total knee arthroplasty, but she is currently at a BMI of 47 and will need to get down to 230 pounds in order to obtain a BMI of less than 40 to proceed with surgery.  She understands and agrees.  She will follow-up with [place] once she meets this goal and at that point, we will likely start her in prehab to help with the total body deconditioning prior to surgery.  Call with concerns or questions in the meantime.  The patient meets the AMA guidelines for Morbid (severe) obesity with a BMI > 40.0 and I have recommended weight loss.  Follow-Up Instructions: Return for with dr. Ernestina Patches for bilateral hip inj.   Orders:  Orders Placed This Encounter  Procedures   Ambulatory referral to Physical Medicine Rehab   Ambulatory referral to Neurology    No orders of the defined types were placed in this encounter.     Procedures: No procedures performed   Clinical Data: No additional findings.   Subjective: Chief Complaint  Patient presents with   Left Knee - Pain    HPI patient is a 50 year old female who comes in today with chronic bilateral knee pain left greater than right in addition to bilateral  hip pain both equally as bad.  She has been dealing with both issues for a while.  In regards to her knees, she has chronic pain worse with ambulation.  She has been primarily ambulating in a wheelchair for the past 8 to 9 months due to the pain.  She has been taking oxycodone from her PCP.  She has previously tried cortisone and viscosupplementation injections without relief.  In regards to her hips, the pain is to the lower back on both sides and radiates into the groin.  Pain is worse with ambulation and moving her hips in certain directions.  No previous cortisone injection to either hip.  She is status post L3-5 lumbar fusion by Dr. Trenton Gammon a few years ago.  Review of Systems as detailed in HPI.  All others reviewed and are negative.   Objective: Vital Signs: Ht 5\' 4"  (1.626 m)   Wt 274 lb 3.2 oz (124.4 kg)   BMI 47.07 kg/m   Physical Exam well-developed well-nourished female no acute distress.  Alert and oriented x3.  Ortho Exam bilateral hip exam shows positive logroll.  Positive FADIR.  Positive straight leg raise.  Knee exam shows range of motion from 0 to 100 degrees.  Medial joint line tenderness.  Moderate patellofemoral crepitus.  Stable.  She is  neurovascular tact distally.  Specialty Comments:  No specialty comments available.  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   GERD (gastroesophageal reflux disease) 07/24/2016   Ganglion cyst of right foot 04/12/2015   Graves disease 10/29/2013   Benign essential HTN 10/29/2013   Lumbar disc disease 10/29/2013   Morbid obesity with BMI of 45.0-49.9, adult ([place]) 10/29/2013   Past Medical History:  Diagnosis Date   Arthritis    Depression    Disc disease with myelopathy, lumbar    Edema     bilateral lower extremities   GERD (gastroesophageal reflux disease)    Graves disease    Hypertension    Muscle spasms of both lower extremities    Sleep apnea    Stomach ulcer    Thyroid cancer ([place])     Family History  Problem Relation Age of Onset   Diabetes Mother    Hypertension Mother    Diabetes Father    Hypertension Father    Heart failure Father     Past Surgical History:  Procedure Laterality Date   CARPAL TUNNEL RELEASE Right    COLONOSCOPY     repair of fissures   glands     under arms-removed   TONSILLECTOMY     Social History   Occupational History   Not on file  Tobacco Use   Smoking status: Every Day    Packs/day: 0.50    Years: 35.00    Pack years: 17.50    Types: Cigarettes   Smokeless tobacco: Never  Vaping Use   Vaping Use: Never used  Substance and Sexual Activity   Alcohol use: Yes   Drug use: Yes    Types: Marijuana    Comment: 2x/daily   Sexual activity: Yes    Birth control/protection: Condom

## 2020-11-02 ENCOUNTER — Encounter: Payer: Self-pay | Admitting: Neurology

## 2020-11-02 ENCOUNTER — Telehealth: Payer: Self-pay | Admitting: Physical Medicine and Rehabilitation

## 2020-11-02 NOTE — Telephone Encounter (Signed)
Pt called shena back.   CB [phone]

## 2020-11-10 ENCOUNTER — Telehealth: Payer: Self-pay | Admitting: Physical Medicine and Rehabilitation

## 2020-11-10 NOTE — Telephone Encounter (Signed)
Pt states she missed a call but if she could get a CB around 2:30 or 3 she'll be able to answer for sure.   [phone]

## 2020-11-25 ENCOUNTER — Ambulatory Visit: Payer: Medicare Other | Admitting: Endocrinology

## 2021-01-16 ENCOUNTER — Other Ambulatory Visit: Payer: Self-pay

## 2021-01-16 ENCOUNTER — Ambulatory Visit (INDEPENDENT_AMBULATORY_CARE_PROVIDER_SITE_OTHER): Payer: Medicare Other | Admitting: Neurology

## 2021-01-16 ENCOUNTER — Encounter: Payer: Self-pay | Admitting: Neurology

## 2021-01-16 VITALS — BP 114/79 | HR 94 | Ht 64.0 in | Wt 276.0 lb

## 2021-01-16 DIAGNOSIS — M62838 Other muscle spasm: Secondary | ICD-10-CM | POA: Diagnosis not present

## 2021-01-16 MED ORDER — CYCLOBENZAPRINE HCL 5 MG PO TABS: 5.0000 mg | ORAL_TABLET | Freq: Three times a day (TID) | ORAL | 2 refills | Status: DC | PRN

## 2021-01-16 NOTE — Patient Instructions (Signed)
Start flexeril 5mg  three times daily as needed.  You can have this adjusted by your primary care doctor or you are welcome to come back for a follow-up visit.

## 2021-01-16 NOTE — Progress Notes (Signed)
[facility] Clinic Note - Initial Visit   Date: 01/17/21  Tonya Greene MRN: [id number] DOB: [birth year]   Dear Rushie Chestnut:  Thank you for your kind referral of Tonya Greene for consultation of muscle spasms. Although her history is well known to you, please allow [place] to reiterate it for the purpose of our medical record. The patient was accompanied to the clinic by mother who also provides collateral information.     History of Present Illness: Tonya Greene is a 50 y.o. right-handed female with history of lumbar surgery, depression, GERD, hypertension, OSA, Graves disease with secondary hypothyroidism, osteoarthritis presenting for evaluation of muscle spasms.  Patient arrived in wheelchair accompanied by her mother.  For the past 3 years, she has whole body muscle spasms involving "head to toe". Spasm do not involve one particular area. Sometimes, her neck will spasm and she will get stuck in the position until it releases. She has associated pain. She cannot do PT because of severe OA involving her hips and knees. She is hoping to have surgery for this.  She has tried muscle relaxants which has helped.  Currently,she is not taking anything.  Out-side paper records, electronic medical record, and images have been reviewed where available and summarized as:  MRI lumbar spine 12/20/2018: 1. Postop fluid collections bilaterally in the laminectomy beds at L3-4 and L4-5. I do not see any obvious rim enhancement but could not exclude CSF leak or an abscess. 2. Fairly significant inflammation/edema/fluid and apparent swelling of the paraspinal muscles somewhat unexpected 1 month after surgery. 3. Inflammation/edema and enhancement in the subcutaneous tissues without discrete rim enhancing fluid collection to suggest a drainable abscess. There is some gas in the soft tissues likely related to the open wound. 4. No findings suspicious for an epidural  abscess or osteomyelitis.  Lab Results  Component Value Date   HGBA1C 5.3 08/16/2015   No results found for: [id number] Lab Results  Component Value Date   TSH 4.18 09/30/2020   No results found for: ESRSEDRATE, POCTSEDRATE  Past Medical History:  Diagnosis Date   Arthritis    Depression    Disc disease with myelopathy, lumbar    Edema    bilateral lower extremities   GERD (gastroesophageal reflux disease)    Graves disease    Hypertension    Muscle spasms of both lower extremities    Sleep apnea    Stomach ulcer    Thyroid cancer (HCC)     Past Surgical History:  Procedure Laterality Date   CARPAL TUNNEL RELEASE Right    COLONOSCOPY     repair of fissures   glands     under arms-removed   TONSILLECTOMY       Medications:  Outpatient Encounter Medications as of 01/16/2021  Medication Sig   albuterol (PROVENTIL HFA;VENTOLIN HFA) 108 (90 Base) MCG/ACT inhaler Inhale 2 puffs into the lungs every 6 (six) hours as needed for wheezing or shortness of breath.   cetirizine (ZYRTEC) 10 MG tablet Take 1 tablet (10 mg total) by mouth daily. Need annual visit for further refills (Patient taking differently: Take 10 mg by mouth daily.)   cyclobenzaprine (FLEXERIL) 5 MG tablet Take 1 tablet (5 mg total) by mouth 3 (three) times daily as needed for muscle spasms.   diazepam (VALIUM) 5 MG tablet Take 1-2 tablets (5-10 mg total) by mouth every 6 (six) hours as needed for muscle spasms.   famotidine (PEPCID) 20 MG tablet Take 20  mg by mouth 2 (two) times daily.   furosemide (LASIX) 40 MG tablet Take 1 tablet (40 mg total) by mouth 2 (two) times daily.   levothyroxine (SYNTHROID) 150 MCG tablet TAKE 1 TABLET (150 MCG TOTAL) BY MOUTH DAILY BEFORE BREAKFAST.   lisinopril (ZESTRIL) 10 MG tablet Take 1 tablet (10 mg total) by mouth daily.   NIFEdipine (PROCARDIA-XL/ADALAT CC) 60 MG 24 hr tablet TAKE 1 TABLET BY MOUTH DAILY (Patient taking differently: Take 60 mg by mouth daily.)    oxyCODONE 10 MG TABS Take 1 tablet (10 mg total) by mouth every 3 (three) hours as needed for severe pain ((score 7 to 10)).   potassium chloride (KLOR-CON) 10 MEQ tablet Take 2 tablets (20 mEq total) by mouth 2 (two) times daily.   promethazine (PHENERGAN) 6.25 MG/5ML syrup Take 10 mLs by mouth 3 (three) times daily.   No facility-administered encounter medications on file as of 01/16/2021.    Allergies:  Allergies  Allergen Reactions   Tape     Tears skin    Family History: Family History  Problem Relation Age of Onset   Diabetes Mother    Hypertension Mother    Hypertension Father    Heart failure Father     Social History: Social History   Tobacco Use   Smoking status: Every Day    Packs/day: 0.50    Years: 35.00    Pack years: 17.50    Types: Cigarettes   Smokeless tobacco: Never  Vaping Use   Vaping Use: Never used  Substance Use Topics   Alcohol use: Yes   Drug use: Yes    Types: Marijuana    Comment: 2x/daily   Social History   Social History Narrative   Right Handed    Lives in a one story home     Vital Signs:  BP 114/79   Pulse 94   Ht 5\' 4"  (1.626 m)   Wt 276 lb (125.2 kg)   SpO2 98%   BMI 47.38 kg/m   Neurological Exam: MENTAL STATUS including orientation to time, place, person, recent and remote memory, attention span and concentration, language, and fund of knowledge is normal.  Speech is not dysarthric.  CRANIAL NERVES: II:  No visual field defects.    III-IV-VI: Pupils equal round and reactive to light.  Normal conjugate, extra-ocular eye movements in all directions of gaze.  No nystagmus.  No ptosis.   VII:  Normal facial symmetry and movements.   VIII:  Normal hearing and vestibular function.   IX-X:  Normal palatal movement.   XI:  Normal shoulder shrug and head rotation.   XII:  Normal tongue strength and range of motion, no deviation or fasciculation.  MOTOR: Motor strength in the upper extremities is 5/5 throughout.  Motor  strength in the legs is limited by pain, at least antigravity throughout and about to resist.  She had random neck and back extension, which she called her spasm, lasting about 15 seconds. Movement appears nonphsyiologic.  MSRs:  Right        Left                  brachioradialis 2+  2+  biceps 2+  2+  triceps 2+  2+  patellar 1+  1+  ankle jerk 1+  1+  Hoffman no  no  plantar response down  down   SENSORY:  Normal and symmetric perception of light touch, pinprick, vibration.   COORDINATION/GAIT: Nonphysiologic movements with finger-to-  nose-finger testing, but eventually able to touch her nose.  Gait not tested, patient in wheelchair.  IMPRESSION: Chronic generalized muscle spasms. No exam findings to suggest CNS pathology or myopathy.   EMG would be very technically challenging due to body habitus, so mutually decided against this. She has previously been on muscle relaxants which has helped. I will start flexeril 5mg  TID prn, this can be adjusted by PCP as needed. Encouraged her to stay well-hydrated.  Thank you for allowing me to participate in patient's care.  If I can answer any additional questions, I would be pleased to do so.    Sincerely,    Kiandre Spagnolo K. Allena Katz, DO

## 2021-01-23 ENCOUNTER — Other Ambulatory Visit: Payer: Self-pay

## 2021-01-23 ENCOUNTER — Other Ambulatory Visit: Payer: Self-pay | Admitting: Endocrinology

## 2021-01-23 ENCOUNTER — Other Ambulatory Visit: Payer: Medicare Other

## 2021-01-25 ENCOUNTER — Other Ambulatory Visit: Payer: Self-pay | Admitting: Endocrinology

## 2021-01-26 ENCOUNTER — Ambulatory Visit: Payer: Medicare Other | Admitting: Endocrinology

## 2021-02-24 ENCOUNTER — Other Ambulatory Visit: Payer: Self-pay | Admitting: Endocrinology

## 2021-04-11 ENCOUNTER — Other Ambulatory Visit: Payer: Self-pay | Admitting: Endocrinology

## 2021-04-11 ENCOUNTER — Other Ambulatory Visit: Payer: Self-pay | Admitting: Neurology

## 2021-04-11 ENCOUNTER — Other Ambulatory Visit (INDEPENDENT_AMBULATORY_CARE_PROVIDER_SITE_OTHER): Payer: Medicare Other

## 2021-04-11 ENCOUNTER — Other Ambulatory Visit: Payer: Self-pay

## 2021-04-11 DIAGNOSIS — E890 Postprocedural hypothyroidism: Secondary | ICD-10-CM

## 2021-04-12 LAB — TSH: TSH: 4.07 u[IU]/mL (ref 0.35–5.50)

## 2021-04-12 LAB — T4, FREE: Free T4: 0.95 ng/dL (ref 0.60–1.60)

## 2021-04-17 ENCOUNTER — Other Ambulatory Visit: Payer: Self-pay

## 2021-04-17 ENCOUNTER — Encounter: Payer: Self-pay | Admitting: Endocrinology

## 2021-04-17 ENCOUNTER — Ambulatory Visit (INDEPENDENT_AMBULATORY_CARE_PROVIDER_SITE_OTHER): Payer: Medicare Other | Admitting: Endocrinology

## 2021-04-17 VITALS — BP 122/78 | HR 90 | Ht 64.0 in | Wt 273.2 lb

## 2021-04-17 DIAGNOSIS — E890 Postprocedural hypothyroidism: Secondary | ICD-10-CM | POA: Diagnosis not present

## 2021-04-17 MED ORDER — LEVOTHYROXINE SODIUM 150 MCG PO TABS: ORAL_TABLET | ORAL | 1 refills | Status: DC

## 2021-04-17 NOTE — Progress Notes (Signed)
Patient ID: Tonya Greene, female   DOB: 1970/07/30, 51 y.o.   MRN: [id number] ? ?       ? ? ? ? ? ?Reason for Appointment:  Hypothyroidism, follow-up visit  ? ? ? History of Present Illness:  ? ?Hypothyroidism was first diagnosed in 1996 ?Prior to this she had Graves' disease treated with I-131 for her hyperthyroidism but details of this are not available ?She thinks she was started on levothyroxine after thyroid ablation ?Again no prior records are available except since 2015 when she moved back here ? ?She has been followed periodically by her PCP and has been mostly taking 175 ?g of levothyroxine ?Review of her labs show low normal TSH in 2015 and 2016 but mild increase in 2017, further increased to 11.77 in 6/18 ? ?Recent history: ?She had previously been given doses up to 200 ?g before her initial visit in August 2018 ?However with this her TSH was low at 0.18 ? ?Her levothyroxine doses were progressively reduced in 2019 ? ?She is coming back now for follow-up after more than a year ?At that time she was taking 150 mcg levothyroxine prescription ?On that visit she was told to continue 6-1/2 tablets a week of the 150 mcg since her TSH was normal ? ?She does not complain of any fatigue ? ?No cold intolerance ?Weight is about the same recently ? ?Has been regular with taking levothyroxine at least an hour before breakfast without any iron or calcium ? ?Her TSH is now running just over 4 ?        ?Patient's weight history is as follows: ? ?Wt Readings from Last 3 Encounters:  ?04/17/21 273 lb 3.2 oz (123.9 kg)  ?01/16/21 276 lb (125.2 kg)  ?11/01/20 274 lb 3.2 oz (124.4 kg)  ? ? ?Thyroid function results have been as follows: ? ?Lab Results  ?Component Value Date  ? TSH 4.07 04/11/2021  ? TSH 4.18 09/30/2020  ? TSH 3.04 01/19/2020  ? TSH 0.34 (L) 06/29/2019  ? FREET4 0.95 04/11/2021  ? FREET4 0.97 09/30/2020  ? FREET4 0.78 01/19/2020  ? FREET4 1.21 06/29/2019  ? T3FREE 1.5 (L) 11/15/2018  ? T3FREE 3.4 07/24/2016   ? ? ? ?Past Medical History:  ?Diagnosis Date  ? Arthritis   ? Depression   ? Disc disease with myelopathy, lumbar   ? Edema   ? bilateral lower extremities  ? GERD (gastroesophageal reflux disease)   ? Graves disease   ? Hypertension   ? Muscle spasms of both lower extremities   ? Sleep apnea   ? Stomach ulcer   ? Thyroid cancer ([place])   ? ? ?Past Surgical History:  ?Procedure Laterality Date  ? CARPAL TUNNEL RELEASE Right   ? COLONOSCOPY    ? repair of fissures  ? glands    ? under arms-removed  ? TONSILLECTOMY    ? ? ?Family History  ?Problem Relation Age of Onset  ? Diabetes Mother   ? Hypertension Mother   ? Hypertension Father   ? Heart failure Father   ? ? ?Social History:  reports that she has been smoking cigarettes. She has a 17.50 pack-year smoking history. She has never used smokeless tobacco. She reports current alcohol use. She reports current drug use. Drug: Marijuana. ? ?Allergies:  ?Allergies  ?Allergen Reactions  ? Tape   ?  Tears skin  ? ? ?Allergies as of 04/17/2021   ? ?   Reactions  ? Tape   ?  Tears skin  ? ?  ? ?  ?Medication List  ?  ? ?  ? Accurate as of April 17, 2021  4:15 PM. If you have any questions, ask your nurse or doctor.  ?  ?  ? ?  ? ?albuterol 108 (90 Base) MCG/ACT inhaler ?Commonly known as: VENTOLIN HFA ?Inhale 2 puffs into the lungs every 6 (six) hours as needed for wheezing or shortness of breath. ?  ?cetirizine 10 MG tablet ?Commonly known as: ZYRTEC ?Take 1 tablet (10 mg total) by mouth daily. Need annual visit for further refills ?What changed: additional instructions ?  ?cyclobenzaprine 5 MG tablet ?Commonly known as: FLEXERIL ?TAKE 1 TABLET (5 MG TOTAL) BY MOUTH 3 (THREE) TIMES DAILY AS NEEDED FOR MUSCLE SPASMS. ?  ?diazepam 5 MG tablet ?Commonly known as: VALIUM ?Take 1-2 tablets (5-10 mg total) by mouth every 6 (six) hours as needed for muscle spasms. ?  ?famotidine 20 MG tablet ?Commonly known as: PEPCID ?Take 20 mg by mouth 2 (two) times daily. ?  ?furosemide 40 MG  tablet ?Commonly known as: Lasix ?Take 1 tablet (40 mg total) by mouth 2 (two) times daily. ?  ?levothyroxine 150 MCG tablet ?Commonly known as: SYNTHROID ?1 tablet every morning, make follow-up visit for further refills ?  ?lisinopril 10 MG tablet ?Commonly known as: ZESTRIL ?Take 1 tablet (10 mg total) by mouth daily. ?  ?NIFEdipine 60 MG 24 hr tablet ?Commonly known as: ADALAT CC ?TAKE 1 TABLET BY MOUTH DAILY ?  ?Oxycodone HCl 10 MG Tabs ?Take 1 tablet (10 mg total) by mouth every 3 (three) hours as needed for severe pain ((score 7 to 10)). ?  ?potassium chloride 10 MEQ tablet ?Commonly known as: KLOR-CON ?Take 2 tablets (20 mEq total) by mouth 2 (two) times daily. ?  ?promethazine 6.25 MG/5ML syrup ?Commonly known as: PHENERGAN ?Take 10 mLs by mouth 3 (three) times daily. ?  ? ?  ? ? ?  ? ?Review of Systems ? ?She is waiting to have knee surgery but has been told to lose weight ? ?Takes Lasix for edema ?Followed by PCP for hypertension ?      ? Examination: ?  ? BP 122/78   Pulse 90   Ht '5\' 4"'$  (1.626 m)   Wt 273 lb 3.2 oz (123.9 kg)   SpO2 99%   BMI 46.89 kg/m?  ? ?Biceps reflexes show normal relaxation ? ? ?Assessment: ? 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 ?She has been on 150 mcg, 6-1/2 tablets a week since 2021 ?Subjectively doing well  ?Although previously her TSH was about 3 to just above 4.0 ? ? ?PLAN:  ? ?She can now take 150 mcg levothyroxine every day ? ?Remind her to take this before eating in the morning and not take any vitamins with calcium or iron at the same time ? ? ?Follow-up in 6 months, to call if she has any unusual fatigue ? ?Tonya Greene ?04/17/2021, 4:15 PM  ? ? ? ?Note: This office note was prepared with Estate agent. Any transcriptional errors that result from this process are unintentional. ? ?

## 2021-10-16 ENCOUNTER — Other Ambulatory Visit (INDEPENDENT_AMBULATORY_CARE_PROVIDER_SITE_OTHER): Payer: Medicare Other

## 2021-10-16 DIAGNOSIS — E890 Postprocedural hypothyroidism: Secondary | ICD-10-CM

## 2021-10-17 LAB — TSH: TSH: 0.34 u[IU]/mL — ABNORMAL LOW (ref 0.35–5.50)

## 2021-10-17 LAB — T4, FREE: Free T4: 0.82 ng/dL (ref 0.60–1.60)

## 2021-10-19 ENCOUNTER — Ambulatory Visit (INDEPENDENT_AMBULATORY_CARE_PROVIDER_SITE_OTHER): Payer: Medicare Other | Admitting: Endocrinology

## 2021-10-19 ENCOUNTER — Encounter: Payer: Self-pay | Admitting: Endocrinology

## 2021-10-19 VITALS — BP 130/82 | HR 104 | Resp 99 | Ht 64.0 in | Wt 271.0 lb

## 2021-10-19 DIAGNOSIS — E890 Postprocedural hypothyroidism: Secondary | ICD-10-CM

## 2021-10-19 MED ORDER — LEVOTHYROXINE SODIUM 137 MCG PO TABS: ORAL_TABLET | ORAL | 2 refills | Status: DC

## 2021-10-19 NOTE — Progress Notes (Unsigned)
Patient ID: Tonya Greene, female   DOB: 10/14/1970, 51 y.o.   MRN: [id number]              Reason for Appointment:  Hypothyroidism, follow-up visit     History of Present Illness:   Hypothyroidism was first diagnosed in 1996 Prior to this she had Graves' disease treated with I-131 for her hyperthyroidism but details of this are not available She thinks she was started on levothyroxine after thyroid ablation Again no prior records are available except since 2015 when she moved back here  She has been followed periodically by her PCP and has been mostly taking 175 g of levothyroxine Review of her labs show low normal TSH in 2015 and 2016 but mild increase in 2017, further increased to 11.77 in 6/18  Recent history: She had previously been given doses up to 200 g before her initial visit in August 2018 However with this her TSH was low at 0.18  Her levothyroxine doses were progressively reduced in 2019   At that time she was taking 150 mcg levothyroxine prescription On that visit she was told to continue 6-1/2 tablets a week of the 150 mcg since her TSH was normal  She does not complain of any fatigue  No cold intolerance Weight is about the same recently  Has been regular with taking levothyroxine at least an hour before breakfast without any iron or calcium  Her TSH is now  just over 4         Patient's weight history is as follows:  Wt Readings from Last 3 Encounters:  10/19/21 271 lb (122.9 kg)  04/17/21 273 lb 3.2 oz (123.9 kg)  01/16/21 276 lb (125.2 kg)    Thyroid function results have been as follows:  Lab Results  Component Value Date   TSH 0.34 (L) 10/16/2021   TSH 4.07 04/11/2021   TSH 4.18 09/30/2020   TSH 3.04 01/19/2020   FREET4 0.82 10/16/2021   FREET4 0.95 04/11/2021   FREET4 0.97 09/30/2020   FREET4 0.78 01/19/2020   T3FREE 1.5 (L) 11/15/2018   T3FREE 3.4 07/24/2016     Past Medical History:  Diagnosis Date   Arthritis    Depression     Disc disease with myelopathy, lumbar    Edema    bilateral lower extremities   GERD (gastroesophageal reflux disease)    Graves disease    Hypertension    Muscle spasms of both lower extremities    Sleep apnea    Stomach ulcer    Thyroid cancer (HCC)     Past Surgical History:  Procedure Laterality Date   CARPAL TUNNEL RELEASE Right    COLONOSCOPY     repair of fissures   glands     under arms-removed   TONSILLECTOMY      Family History  Problem Relation Age of Onset   Diabetes Mother    Hypertension Mother    Hypertension Father    Heart failure Father     Social History:  reports that she has been smoking cigarettes. She has a 17.50 pack-year smoking history. She has never used smokeless tobacco. She reports current alcohol use. She reports current drug use. Drug: Marijuana.  Allergies:  Allergies  Allergen Reactions   Tape     Tears skin    Allergies as of 10/19/2021       Reactions   Tape    Tears skin        Medication List  Accurate as of October 19, 2021  4:00 PM. If you have any questions, ask your nurse or doctor.          albuterol 108 (90 Base) MCG/ACT inhaler Commonly known as: VENTOLIN HFA Inhale 2 puffs into the lungs every 6 (six) hours as needed for wheezing or shortness of breath.   cetirizine 10 MG tablet Commonly known as: ZYRTEC Take 1 tablet (10 mg total) by mouth daily. Need annual visit for further refills What changed: additional instructions   cyclobenzaprine 5 MG tablet Commonly known as: FLEXERIL TAKE 1 TABLET (5 MG TOTAL) BY MOUTH 3 (THREE) TIMES DAILY AS NEEDED FOR MUSCLE SPASMS.   diazepam 5 MG tablet Commonly known as: VALIUM Take 1-2 tablets (5-10 mg total) by mouth every 6 (six) hours as needed for muscle spasms.   famotidine 20 MG tablet Commonly known as: PEPCID Take 20 mg by mouth 2 (two) times daily.   furosemide 40 MG tablet Commonly known as: Lasix Take 1 tablet (40 mg total) by mouth 2  (two) times daily.   levothyroxine 150 MCG tablet Commonly known as: SYNTHROID 1 tablet every morning before eating   lisinopril 10 MG tablet Commonly known as: ZESTRIL Take 1 tablet (10 mg total) by mouth daily.   NIFEdipine 60 MG 24 hr tablet Commonly known as: ADALAT CC TAKE 1 TABLET BY MOUTH DAILY   Oxycodone HCl 10 MG Tabs Take 1 tablet (10 mg total) by mouth every 3 (three) hours as needed for severe pain ((score 7 to 10)).   potassium chloride 10 MEQ tablet Commonly known as: KLOR-CON Take 2 tablets (20 mEq total) by mouth 2 (two) times daily.   promethazine 6.25 MG/5ML syrup Commonly known as: PHENERGAN Take 10 mLs by mouth 3 (three) times daily.           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 She had been requiring variable doses of levothyroxine   She had been on 150 mcg, 6-1/2 tablets a week on her last visit However appears that maybe with switching pharmacies she is now taking 137 mcg daily No symptoms of unusual fatigue or anxiety  Surprisingly her TSH which was high normal is now just below 0.25  Discussed that her blurred vision is unlikely to be related to thyroid eye disease   PLAN:   She can now take 137 mcg levothyroxine 6-1/2 days a week but try to stay with the same pharmacy Follow-up in 4 months  Voncille Simm 10/19/2021, 4:00 PM     Note: This office note was prepared with Dragon voice recognition system technology. Any transcriptional errors that result from this process are unintentional.

## 2021-10-19 NOTE — Patient Instructions (Signed)
Take 137 ug pills, 1 daily except 1/2 on Sundays

## 2021-10-30 ENCOUNTER — Other Ambulatory Visit: Payer: Self-pay | Admitting: Neurology

## 2021-11-02 ENCOUNTER — Other Ambulatory Visit: Payer: Self-pay | Admitting: Endocrinology

## 2021-12-21 ENCOUNTER — Other Ambulatory Visit: Payer: Self-pay

## 2021-12-21 MED ORDER — LEVOTHYROXINE SODIUM 137 MCG PO TABS: ORAL_TABLET | ORAL | 2 refills | Status: DC

## 2022-02-22 ENCOUNTER — Other Ambulatory Visit: Payer: Medicare Other

## 2022-02-26 ENCOUNTER — Ambulatory Visit: Payer: Medicare Other | Admitting: Endocrinology

## 2023-05-21 ENCOUNTER — Other Ambulatory Visit: Payer: Self-pay

## 2023-05-21 ENCOUNTER — Telehealth: Payer: Self-pay | Admitting: Endocrinology

## 2023-05-21 DIAGNOSIS — E890 Postprocedural hypothyroidism: Secondary | ICD-10-CM

## 2023-05-21 MED ORDER — LEVOTHYROXINE SODIUM 137 MCG PO TABS: ORAL_TABLET | ORAL | 0 refills | Status: DC

## 2023-05-21 NOTE — Telephone Encounter (Signed)
 MEDICATION: Levothyroxine 137 MCG  PHARMACY:  [facility]  HAS THE PATIENT CONTACTED THEIR PHARMACY?  yes  IS THIS A 90 DAY SUPPLY : unknown  IS PATIENT OUT OF MEDICATION: YES  IF NOT; HOW MUCH IS LEFT: NONE  LAST APPOINTMENT DATE: @09 /14/2023  NEXT APPOINTMENT DATE:@6 /11/2023  DO WE HAVE YOUR PERMISSION TO LEAVE A DETAILED MESSAGE?: yes  OTHER COMMENTS:    **Let patient know to contact pharmacy at the end of the day to make sure medication is ready. **  ** Please notify patient to allow 48-72 hours to process**  **Encourage patient to contact the pharmacy for refills or they can request refills through [facility]**

## 2023-07-16 ENCOUNTER — Encounter: Payer: Self-pay | Admitting: Endocrinology

## 2023-07-16 ENCOUNTER — Ambulatory Visit (INDEPENDENT_AMBULATORY_CARE_PROVIDER_SITE_OTHER): Admitting: Endocrinology

## 2023-07-16 VITALS — BP 116/60 | HR 90 | Resp 16 | Ht 64.0 in | Wt 289.2 lb

## 2023-07-16 DIAGNOSIS — Z8639 Personal history of other endocrine, nutritional and metabolic disease: Secondary | ICD-10-CM

## 2023-07-16 DIAGNOSIS — Z131 Encounter for screening for diabetes mellitus: Secondary | ICD-10-CM

## 2023-07-16 DIAGNOSIS — E890 Postprocedural hypothyroidism: Secondary | ICD-10-CM | POA: Diagnosis not present

## 2023-07-16 NOTE — Progress Notes (Signed)
 Outpatient Endocrinology Note Tonya Shaquan Puerta, MD   Patient's Name: Tonya Greene    DOB: 1970/06/17    MRN: [id number]  REASON OF VISIT: Follow-up for hypothyroidism  PCP: Danella Dunn, MD  HISTORY OF PRESENT ILLNESS:   Tonya Greene is a 53 y.o. old female with past medical history as listed below is presented for a follow up for postablative hypothyroidism.   Pertinent Thyroid  History: Patient was previously seen by Dr. Hubert Madden and was last time seen in September 2023.  Patient was diagnosed with hypothyroidism in 1996 prior to that she had Graves' disease treated with RAI I-131 for hyperthyroidism but detail records not available.  She moved to the area in 2015.  Prior to seen in the endocrinology clinic she was.  Regularly following with PCP taking levothyroxine  mostly 175 mcg daily and highest dose she had taken up to 200 mcg daily.  Levothyroxine  dose was adjusted over time, lately she has been taking levothyroxine  137 mcg daily for 6 days and half tablet on Sundays.  Note : patient reports she has history of thyroid  cancer however did not underwent thyroid  surgery no records to support thyroid  cancer is available to review.  Prior endocrinologist Dr. Hubert Madden did not mention anything about having thyroid  cancer.  Other than on her problem list mentioning thyroid  cancer no other records available to review.  She did not have thyroid  surgery in the past.   Interval history Patient has been taking levothyroxine  137 mcg daily for 6 days and half tablet on Sundays.  Denies palpitation and heat intolerance, she reports taking in the morning as soon as she wakes up.  Reports compliance.  Overall feeling usual energy and normal state of health.  No cold intolerance.  No change in bowel habits.  No other complaints today.  She is accompanied by mother in the clinic today.  REVIEW OF SYSTEMS:  As per history of present illness.   PAST MEDICAL HISTORY: Past Medical History:  Diagnosis Date    Arthritis    Depression    Disc disease with myelopathy, lumbar    Edema    bilateral lower extremities   GERD (gastroesophageal reflux disease)    Graves disease    Hypertension    Muscle spasms of both lower extremities    Sleep apnea    Stomach ulcer    Thyroid  cancer (HCC)     PAST SURGICAL HISTORY: Past Surgical History:  Procedure Laterality Date   CARPAL TUNNEL RELEASE Right    COLONOSCOPY     repair of fissures   glands     under arms-removed   TONSILLECTOMY      ALLERGIES: Allergies  Allergen Reactions   Tape     Tears skin    FAMILY HISTORY:  Family History  Problem Relation Age of Onset   Diabetes Mother    Hypertension Mother    Hypertension Father    Heart failure Father     SOCIAL HISTORY: Social History   Socioeconomic History   Marital status: Single    Spouse name: Not on file   Number of children: Not on file   Years of education: Not on file   Highest education level: Not on file  Occupational History   Not on file  Tobacco Use   Smoking status: Every Day    Current packs/day: 0.50    Average packs/day: 0.5 packs/day for 35.0 years (17.5 ttl pk-yrs)    Types: Cigarettes   Smokeless tobacco: Never  Vaping Use   Vaping status: Never Used  Substance and Sexual Activity   Alcohol use: Yes   Drug use: Yes    Types: Marijuana    Comment: 2x/daily   Sexual activity: Yes    Birth control/protection: Condom  Other Topics Concern   Not on file  Social History Narrative   Right Handed    Lives in a one story home    Social Drivers of Health   Financial Resource Strain: Not on file  Food Insecurity: Not on file  Transportation Needs: Not on file  Physical Activity: Not on file  Stress: Not on file  Social Connections: Not on file    MEDICATIONS:  Current Outpatient Medications  Medication Sig Dispense Refill   albuterol  (PROVENTIL  HFA;VENTOLIN  HFA) 108 (90 Base) MCG/ACT inhaler Inhale 2 puffs into the lungs every 6 (six)  hours as needed for wheezing or shortness of breath. 1 Inhaler 2   cetirizine  (ZYRTEC ) 10 MG tablet Take 1 tablet (10 mg total) by mouth daily. Need annual visit for further refills (Patient taking differently: Take 10 mg by mouth daily.) 90 tablet 0   cyclobenzaprine  (FLEXERIL ) 5 MG tablet TAKE 1 TABLET (5 MG TOTAL) BY MOUTH 3 (THREE) TIMES DAILY AS NEEDED FOR MUSCLE SPASMS. 90 tablet 2   diazepam  (VALIUM ) 5 MG tablet Take 1-2 tablets (5-10 mg total) by mouth every 6 (six) hours as needed for muscle spasms. 40 tablet 0   famotidine  (PEPCID ) 20 MG tablet Take 20 mg by mouth 2 (two) times daily.     levothyroxine  (SYNTHROID ) 137 MCG tablet 1 tablet before breakfast 6 days a week and half tablet on seventh day 60 tablet 0   lisinopril  (ZESTRIL ) 10 MG tablet Take 1 tablet (10 mg total) by mouth daily. 90 tablet 1   NIFEdipine  (PROCARDIA -XL/ADALAT  CC) 60 MG 24 hr tablet TAKE 1 TABLET BY MOUTH DAILY (Patient taking differently: Take 60 mg by mouth daily.) 90 tablet 1   oxyCODONE  10 MG TABS Take 1 tablet (10 mg total) by mouth every 3 (three) hours as needed for severe pain ((score 7 to 10)). 50 tablet 0   potassium chloride  (KLOR-CON ) 10 MEQ tablet Take 2 tablets (20 mEq total) by mouth 2 (two) times daily. 120 tablet 2   promethazine  (PHENERGAN ) 6.25 MG/5ML syrup Take 10 mLs by mouth 3 (three) times daily.     furosemide  (LASIX ) 40 MG tablet Take 1 tablet (40 mg total) by mouth 2 (two) times daily. 60 tablet 2   No current facility-administered medications for this visit.    PHYSICAL EXAM: Vitals:   07/16/23 1512  BP: 116/60  Pulse: 90  Resp: 16  SpO2: 97%  Weight: 289 lb 3.2 oz (131.2 kg)  Height: 5\' 4"  (1.626 m)   Body mass index is 49.64 kg/m.  Wt Readings from Last 3 Encounters:  07/16/23 289 lb 3.2 oz (131.2 kg)  10/19/21 271 lb (122.9 kg)  04/17/21 273 lb 3.2 oz (123.9 kg)    General: Well developed, well nourished female in no apparent distress.  HEENT: AT/, no external  lesions. Hearing intact to the spoken word Eyes: EOMI. No stare, proptosis or lid lag. Conjunctiva clear and no icterus. Neck: Trachea midline, neck supple without appreciable thyromegaly or lymphadenopathy and no palpable thyroid  nodules Lungs: Clear to auscultation, no wheeze. Respirations not labored Heart: S1S2, Regular in rate and rhythm.  Abdomen: Soft, non tender Neurologic: Alert, oriented, normal speech, deep tendon biceps reflexes normal Extremities: No pedal  pitting edema Skin: Warm, color good.  Psychiatric: Does not appear depressed or anxious  PERTINENT HISTORIC LABORATORY AND IMAGING STUDIES:  All pertinent laboratory results were reviewed. Please see HPI also for further details.   TSH  Date Value Ref Range Status  10/16/2021 0.34 (L) 0.35 - 5.50 uIU/mL Final  04/11/2021 4.07 0.35 - 5.50 uIU/mL Final  09/30/2020 4.18 0.35 - 5.50 uIU/mL Final     ASSESSMENT / PLAN  1. Postablative hypothyroidism   2. Screening for diabetes mellitus   3. H/O Graves' disease    - Patient has postablative hypothyroidism for several years, from 81.  She has been taking levothyroxine  137 mcg daily for 6 days and half tablet on Sundays.  She is overall euthyroid clinically in the clinic today.   We discussed the medical need for compliance with levothyroxine  therapy, that it is a hormone necessary for life, and that serious consequences may result from noncompliance. Discussed the proper method of levothyroxine  administration: take on an empty stomach in the morning, with water, waiting thirty to sixty minutes before taking any other beverages or food. Also reviewed the need to take calcium or iron supplements or multivitamin (that may contain iron or calcium) at least 4 hours after levothyroxine  administration.   Plan: - Check thyroid  function test and adjust the dose of levothyroxine  as needed.  She needs refills. - Discussed about compliance with follow-ups with endocrinology.  Will  follow-up in 4 months and will gradually increase the interval for follow-ups with endocrinology.  Diagnoses and all orders for this visit:  Postablative hypothyroidism -     T4, free -     TSH  Screening for diabetes mellitus -     Hemoglobin A1c  H/O Graves' disease    DISPOSITION Follow up in clinic in 4 months suggested.  All questions answered and patient verbalized understanding of the plan.  Tonya Fines Kimberlin, MD [facility] Endocrinology [facility] [address] [place], [address] [place], [place] [address] Phone # [phone]  At least part of this note was generated using voice recognition software. Inadvertent word errors may have occurred, which were not recognized during the proofreading process.

## 2023-07-17 LAB — HEMOGLOBIN A1C
Hgb A1c MFr Bld: 5.6 % (ref <5.7)
Mean Plasma Glucose: 114 mg/dL
eAG (mmol/L): 6.3 mmol/L

## 2023-07-17 LAB — T4, FREE: Free T4: 0.8 ng/dL (ref 0.8–1.8)

## 2023-07-17 LAB — TSH: TSH: 26.13 m[IU]/L — ABNORMAL HIGH

## 2023-07-18 ENCOUNTER — Other Ambulatory Visit: Payer: Self-pay | Admitting: Endocrinology

## 2023-07-18 ENCOUNTER — Ambulatory Visit: Payer: Self-pay | Admitting: Endocrinology

## 2023-07-18 DIAGNOSIS — E890 Postprocedural hypothyroidism: Secondary | ICD-10-CM

## 2023-07-18 MED ORDER — LEVOTHYROXINE SODIUM 150 MCG PO TABS: 150.0000 ug | ORAL_TABLET | Freq: Every day | ORAL | 3 refills | Status: DC

## 2023-11-19 ENCOUNTER — Ambulatory Visit: Admitting: Endocrinology

## 2023-12-18 ENCOUNTER — Other Ambulatory Visit

## 2023-12-18 ENCOUNTER — Ambulatory Visit (INDEPENDENT_AMBULATORY_CARE_PROVIDER_SITE_OTHER): Admitting: Endocrinology

## 2023-12-18 ENCOUNTER — Encounter: Payer: Self-pay | Admitting: Endocrinology

## 2023-12-18 VITALS — BP 126/80 | HR 85 | Resp 16 | Ht 64.0 in | Wt 293.0 lb

## 2023-12-18 DIAGNOSIS — Z8639 Personal history of other endocrine, nutritional and metabolic disease: Secondary | ICD-10-CM

## 2023-12-18 DIAGNOSIS — E890 Postprocedural hypothyroidism: Secondary | ICD-10-CM

## 2023-12-18 LAB — T4, FREE: Free T4: 0.9 ng/dL (ref 0.8–1.8)

## 2023-12-18 LAB — TSH: TSH: 12.67 m[IU]/L — ABNORMAL HIGH

## 2023-12-18 NOTE — Progress Notes (Addendum)
 Outpatient Endocrinology Note Kalin Kyler, MD   Patient's Name: REGANA KEMPLE    DOB: 01/31/1971    MRN: [id number]  REASON OF VISIT: Follow-up for hypothyroidism  PCP: Ilah Crigler, MD  HISTORY OF PRESENT ILLNESS:   NEVIN KOZUCH is a 53 y.o. old female with past medical history as listed below is presented for a follow up for postablative hypothyroidism.   Pertinent Thyroid  History: Patient was previously seen by Dr. Von and was last time seen in September 2023.  Patient was diagnosed with hypothyroidism in 1996 prior to that she had Graves' disease treated with RAI I-131 for hyperthyroidism but detail records not available.  She moved to the area in 2015.  Prior to seen in the endocrinology clinic she was.  Regularly following with PCP taking levothyroxine  mostly 175 mcg daily and highest dose she had taken up to 200 mcg daily.  Levothyroxine  dose was adjusted over time, she was taking levothyroxine  137 mcg daily for 6 days and half tablet on Sundays, increased to 150 mg daily in June 2025.  Note : patient reports she has history of thyroid  cancer however did not underwent thyroid  surgery no records to support thyroid  cancer is available to review.  Prior endocrinologist Dr. Von did not mention anything about having thyroid  cancer.  Other than on her problem list mentioning thyroid  cancer no other records available to review.  She did not have thyroid  surgery in the past.   Interval history In June 2025 patient had elevated TSH of 26, levothyroxine  was increased to 150 mcg daily from 137 mcg daily for 6 days and half days on Sundays.  Patient has been taking levothyroxine  150 mcg daily.  She reports she may be missing occasionally last week she makes 2 times.  She has complaints of weight gain and low energy.  Lately she also complains of maybe having bug bites and having rashes in the skin, had seen primary care provider.  Discussed about follow-up with primary care provider  regarding skin rashes and bug bites.  No other complaints today.  She is accompanied by mother in the clinic today.  REVIEW OF SYSTEMS:  As per history of present illness.   PAST MEDICAL HISTORY: Past Medical History:  Diagnosis Date   Arthritis    Depression    Disc disease with myelopathy, lumbar    Edema    bilateral lower extremities   GERD (gastroesophageal reflux disease)    Graves disease    Hypertension    Muscle spasms of both lower extremities    Sleep apnea    Stomach ulcer    Thyroid  cancer (HCC)     PAST SURGICAL HISTORY: Past Surgical History:  Procedure Laterality Date   CARPAL TUNNEL RELEASE Right    COLONOSCOPY     repair of fissures   glands     under arms-removed   TONSILLECTOMY      ALLERGIES: Allergies  Allergen Reactions   Tape     Tears skin    FAMILY HISTORY:  Family History  Problem Relation Age of Onset   Diabetes Mother    Hypertension Mother    Hypertension Father    Heart failure Father     SOCIAL HISTORY: Social History   Socioeconomic History   Marital status: Single    Spouse name: Not on file   Number of children: Not on file   Years of education: Not on file   Highest education level: Not on file  Occupational  History   Not on file  Tobacco Use   Smoking status: Every Day    Current packs/day: 0.50    Average packs/day: 0.5 packs/day for 35.0 years (17.5 ttl pk-yrs)    Types: Cigarettes   Smokeless tobacco: Never  Vaping Use   Vaping status: Never Used  Substance and Sexual Activity   Alcohol use: Yes   Drug use: Yes    Types: Marijuana    Comment: 2x/daily   Sexual activity: Yes    Birth control/protection: Condom  Other Topics Concern   Not on file  Social History Narrative   Right Handed    Lives in a one story home    Social Drivers of Health   Financial Resource Strain: Not on file  Food Insecurity: Not on file  Transportation Needs: Not on file  Physical Activity: Not on file  Stress:  Not on file  Social Connections: Not on file    MEDICATIONS:  Current Outpatient Medications  Medication Sig Dispense Refill   albuterol  (PROVENTIL  HFA;VENTOLIN  HFA) 108 (90 Base) MCG/ACT inhaler Inhale 2 puffs into the lungs every 6 (six) hours as needed for wheezing or shortness of breath. 1 Inhaler 2   cetirizine  (ZYRTEC ) 10 MG tablet Take 1 tablet (10 mg total) by mouth daily. Need annual visit for further refills (Patient taking differently: Take 10 mg by mouth daily.) 90 tablet 0   cyclobenzaprine  (FLEXERIL ) 5 MG tablet TAKE 1 TABLET (5 MG TOTAL) BY MOUTH 3 (THREE) TIMES DAILY AS NEEDED FOR MUSCLE SPASMS. 90 tablet 2   diazepam  (VALIUM ) 5 MG tablet Take 1-2 tablets (5-10 mg total) by mouth every 6 (six) hours as needed for muscle spasms. 40 tablet 0   famotidine  (PEPCID ) 20 MG tablet Take 20 mg by mouth 2 (two) times daily.     furosemide  (LASIX ) 40 MG tablet Take 1 tablet (40 mg total) by mouth 2 (two) times daily. 60 tablet 2   lisinopril  (ZESTRIL ) 10 MG tablet Take 1 tablet (10 mg total) by mouth daily. 90 tablet 1   NIFEdipine  (PROCARDIA -XL/ADALAT  CC) 60 MG 24 hr tablet TAKE 1 TABLET BY MOUTH DAILY (Patient taking differently: Take 60 mg by mouth daily.) 90 tablet 1   oxyCODONE  10 MG TABS Take 1 tablet (10 mg total) by mouth every 3 (three) hours as needed for severe pain ((score 7 to 10)). 50 tablet 0   potassium chloride  (KLOR-CON ) 10 MEQ tablet Take 2 tablets (20 mEq total) by mouth 2 (two) times daily. 120 tablet 2   promethazine  (PHENERGAN ) 6.25 MG/5ML syrup Take 10 mLs by mouth 3 (three) times daily.     levothyroxine  (SYNTHROID ) 175 MCG tablet Take 1 tablet (175 mcg total) by mouth daily. 90 tablet 3   No current facility-administered medications for this visit.    PHYSICAL EXAM: Vitals:   12/18/23 1609  BP: 126/80  Pulse: 85  Resp: 16  SpO2: 99%  Weight: 293 lb (132.9 kg)  Height: 5' 4 (1.626 m)    Body mass index is 50.29 kg/m.  Wt Readings from Last 3  Encounters:  12/18/23 293 lb (132.9 kg)  07/16/23 289 lb 3.2 oz (131.2 kg)  10/19/21 271 lb (122.9 kg)    General: Well developed, well nourished female in no apparent distress.  HEENT: AT/[place], no external lesions. Hearing intact to the spoken word Eyes: EOMI. No stare, proptosis or lid lag. Conjunctiva clear and no icterus. Neck: Trachea midline, neck supple without appreciable thyromegaly or lymphadenopathy and no  palpable thyroid  nodules Abdomen: Soft, non tender Neurologic: Alert, oriented, normal speech, deep tendon biceps reflexes normal Extremities: No pedal pitting edema Skin: Warm, color good.  Erythematous rash on the skin.  Dry skin. Psychiatric: Does not appear depressed or anxious  PERTINENT HISTORIC LABORATORY AND IMAGING STUDIES:  All pertinent laboratory results were reviewed. Please see HPI also for further details.   TSH  Date Value Ref Range Status  12/18/2023 12.67 (H) mIU/L Final    Comment:              Reference Range .           > or = 20 Years  0.40-4.50 .                Pregnancy Ranges           First trimester    0.26-2.66           Second trimester   0.55-2.73           Third trimester    0.43-2.91   07/16/2023 26.13 (H) mIU/L Final    Comment:              Reference Range .           > or = 20 Years  0.40-4.50 .                Pregnancy Ranges           First trimester    0.26-2.66           Second trimester   0.55-2.73           Third trimester    0.43-2.91   10/16/2021 0.34 (L) 0.35 - 5.50 uIU/mL Final     ASSESSMENT / PLAN  1. Postablative hypothyroidism   2. H/O Graves' disease    - Patient has postablative hypothyroidism for several years, from 49.  She has been taking levothyroxine  150 mcg daily.  Patient is clinically hypothyroid in the clinic today.  We discussed the medical need for compliance with levothyroxine  therapy, that it is a hormone necessary for life, and that serious consequences may result from noncompliance.  Discussed the proper method of levothyroxine  administration: take on an empty stomach in the morning, with water, waiting thirty to sixty minutes before taking any other beverages or food. Also reviewed the need to take calcium or iron supplements or multivitamin (that may contain iron or calcium) at least 4 hours after levothyroxine  administration.  She is not fully compliant with levothyroxine .  Discussed that if she misses, she can take extra dose of levothyroxine  and make up.  Advised patient to take in average 7 tablets a week.  Plan: -Check thyroid  function test TSH, free T4.  Adjust the dose as needed. -Follow-up in 3 months.  She needs close endocrinology follow-up.  Diagnoses and all orders for this visit:  Postablative hypothyroidism -     T4, free -     TSH  H/O Graves' disease   Labs reviewed still with elevated TSH, although there is concern for compliance I would like to increase levothyroxine  from 150 to 175 mcg daily.  New prescription sent.  Will check thyroid  function test at the follow-up visit in February.   Latest Reference Range & Units 12/18/23 16:22  TSH mIU/L 12.67 (H)  T4,Free(Direct) 0.8 - 1.8 ng/dL 0.9  (H): Data is abnormally high  DISPOSITION Follow up in clinic in 3 months suggested.  Labs today.  All questions answered and patient verbalized understanding of the plan.  Coleman Kalas, MD [facility] Endocrinology [facility] [address] [place], [address] [place], [place] [address] Phone # [phone]  At least part of this note was generated using voice recognition software. Inadvertent word errors may have occurred, which were not recognized during the proofreading process.

## 2023-12-19 ENCOUNTER — Ambulatory Visit: Payer: Self-pay | Admitting: Endocrinology

## 2023-12-19 DIAGNOSIS — E890 Postprocedural hypothyroidism: Secondary | ICD-10-CM

## 2023-12-19 MED ORDER — LEVOTHYROXINE SODIUM 175 MCG PO TABS: 175.0000 ug | ORAL_TABLET | Freq: Every day | ORAL | 3 refills | Status: AC

## 2023-12-19 NOTE — Addendum Note (Signed)
 Addended by: Damontae Loppnow on: 12/19/2023 02:27 PM   Modules accepted: Level of Service

## 2024-03-24 ENCOUNTER — Ambulatory Visit: Admitting: Endocrinology
